# Patient Record
Sex: Male | Born: 1995 | Race: White | Hispanic: No | Marital: Married | Smoking: Never smoker
Health system: Southern US, Community
[De-identification: ages and names within clinical notes are randomized; demographics above are authoritative.]

## PROBLEM LIST (undated history)

## (undated) DIAGNOSIS — G4730 Sleep apnea, unspecified: Secondary | ICD-10-CM

## (undated) DIAGNOSIS — K219 Gastro-esophageal reflux disease without esophagitis: Secondary | ICD-10-CM

## (undated) HISTORY — DX: Sleep apnea, unspecified: G47.30

## (undated) HISTORY — DX: Gastro-esophageal reflux disease without esophagitis: K21.9

---

## 2013-04-10 ENCOUNTER — Encounter (HOSPITAL_COMMUNITY): Payer: Self-pay

## 2013-04-10 ENCOUNTER — Emergency Department (HOSPITAL_COMMUNITY)
Admission: EM | Admit: 2013-04-10 | Discharge: 2013-04-10 | Disposition: A | Discharge status: Home / Self Care | Payer: BC Managed Care – PPO | Attending: Emergency Medicine | Admitting: Emergency Medicine

## 2013-04-10 ENCOUNTER — Emergency Department (HOSPITAL_COMMUNITY): Payer: BC Managed Care – PPO

## 2013-04-10 DIAGNOSIS — Y9389 Activity, other specified: Secondary | ICD-10-CM | POA: Insufficient documentation

## 2013-04-10 DIAGNOSIS — W010XXA Fall on same level from slipping, tripping and stumbling without subsequent striking against object, initial encounter: Secondary | ICD-10-CM | POA: Insufficient documentation

## 2013-04-10 DIAGNOSIS — Y92410 Unspecified street and highway as the place of occurrence of the external cause: Secondary | ICD-10-CM | POA: Insufficient documentation

## 2013-04-10 DIAGNOSIS — S63501A Unspecified sprain of right wrist, initial encounter: Secondary | ICD-10-CM

## 2013-04-10 DIAGNOSIS — S63509A Unspecified sprain of unspecified wrist, initial encounter: Secondary | ICD-10-CM | POA: Insufficient documentation

## 2013-04-10 MED ORDER — ONDANSETRON HCL 4 MG PO TABS
ORAL_TABLET | ORAL | Status: AC
  Administered 2013-04-10: 4 mg
  Filled 2013-04-10: qty 1

## 2013-04-10 MED ORDER — DICLOFENAC SODIUM 75 MG PO TBEC: 75.0000 mg | DELAYED_RELEASE_TABLET | Freq: Two times a day (BID) | ORAL | Status: DC

## 2013-04-10 MED ORDER — KETOROLAC TROMETHAMINE 10 MG PO TABS
ORAL_TABLET | ORAL | Status: AC
  Administered 2013-04-10: 10 mg
  Filled 2013-04-10: qty 1

## 2013-04-10 NOTE — ED Provider Notes (Signed)
History     CSN: [id number]  Arrival date & time 04/10/13  2101   First MD Initiated Contact with Patient 04/10/13 2246      Chief Complaint  Patient presents with  . Wrist Pain  . Fall    (Consider location/radiation/quality/duration/timing/severity/associated sxs/prior treatment) Patient is a 17 y.o. male presenting with wrist pain and fall. The history is provided by the patient.  Wrist Pain This is a new problem. The current episode started yesterday. The problem occurs constantly. The problem has been unchanged. Pertinent negatives include no abdominal pain, arthralgias, chest pain, coughing, neck pain or numbness. Exacerbated by: movement and palpation. He has tried ice and NSAIDs for the symptoms. The treatment provided mild relief.  Fall Pertinent negatives include no abdominal pain, arthralgias, chest pain, coughing, neck pain or numbness.    History reviewed. No pertinent past medical history.  History reviewed. No pertinent past surgical history.  No family history on file.  History  Substance Use Topics  . Smoking status: Never Smoker   . Smokeless tobacco: Not on file  . Alcohol Use: No      Review of Systems  Constitutional: Negative for activity change.       All ROS Neg except as noted in HPI  HENT: Negative for nosebleeds and neck pain.   Eyes: Negative for photophobia and discharge.  Respiratory: Negative for cough, shortness of breath and wheezing.   Cardiovascular: Negative for chest pain and palpitations.  Gastrointestinal: Negative for abdominal pain and blood in stool.  Genitourinary: Negative for dysuria, frequency and hematuria.  Musculoskeletal: Negative for back pain and arthralgias.  Skin: Negative.   Neurological: Negative for dizziness, seizures, speech difficulty and numbness.  Psychiatric/Behavioral: Negative for hallucinations and confusion.    Allergies  Bee venom  Home Medications  No current outpatient prescriptions on  file.  BP 119/73  Pulse 81  Temp(Src) 98.4 F (36.9 C) (Oral)  Resp 20  Ht 6' (1.829 m)  Wt 195 lb (88.451 kg)  BMI 26.44 kg/m2  SpO2 100%  Physical Exam  Nursing note and vitals reviewed. Constitutional: He is oriented to person, place, and time. He appears well-developed and well-nourished.  Non-toxic appearance.  HENT:  Head: Normocephalic.  Right Ear: Tympanic membrane and external ear normal.  Left Ear: Tympanic membrane and external ear normal.  Eyes: EOM and lids are normal. Pupils are equal, round, and reactive to light.  Neck: Normal range of motion. Neck supple. Carotid bruit is not present.  Cardiovascular: Normal rate, regular rhythm, normal heart sounds, intact distal pulses and normal pulses.   Pulmonary/Chest: Breath sounds normal. No respiratory distress.  Abdominal: Soft. Bowel sounds are normal. There is no tenderness. There is no guarding.  Musculoskeletal: Normal range of motion.  There is full range of motion of the right shoulder and elbow. There is soreness with range of motion of the wrist on the right. There is mild-to-moderate swelling of the dorsum of the right hand. Capillary refill is less than 3 seconds. Radial pulses are 2+ and symmetrical.  Lymphadenopathy:       Head (right side): No submandibular adenopathy present.       Head (left side): No submandibular adenopathy present.    He has no cervical adenopathy.  Neurological: He is alert and oriented to person, place, and time. He has normal strength. No cranial nerve deficit or sensory deficit.  Skin: Skin is warm and dry.  Psychiatric: He has a normal mood and affect. His speech  is normal.    ED Course  Procedures (including critical care time)  Labs Reviewed - No data to display Dg Wrist Complete Right  04/10/2013   *RADIOLOGY REPORT*  Clinical Data: Right wrist pain after fall.  RIGHT WRIST - COMPLETE 3+ VIEW  Comparison:  None.  Findings:  There is no evidence of fracture or dislocation.   There is no evidence of arthropathy or other focal bone abnormality. Soft tissues are unremarkable.  IMPRESSION: Negative.   Original Report Authenticated By: Irish Lack, M.D.     No diagnosis found.    MDM  I have reviewed nursing notes, vital signs, and all appropriate lab and imaging results for this patient. Patient fell on the right wrist curled under him on yesterday. (June 9) patient has soreness on last evening, but more pain and swelling today. X-ray of the right wrist is negative for fracture or dislocation.  Patient is fitted with a Velcro wrist splint, to be used for the next 7-10 days. Patient is given an ice pack to use. Prescription for diclofenac 2 times daily with food given to the patient. Patient is to followup with Dr. Romeo Apple if not improving.       Kathie Dike, PA-C 04/10/13 2257

## 2013-04-10 NOTE — ED Notes (Signed)
Fell and injured right wrist per pt.

## 2013-04-10 NOTE — ED Notes (Addendum)
Larey Seat last night -says he tripped in his bedroom, injurying his rt wrist.. Given ice pack, sl swelling present.  Good pulse and distal sensation

## 2013-04-12 NOTE — ED Provider Notes (Signed)
Medical screening examination/treatment/procedure(s) were performed by non-physician practitioner and as supervising physician I was immediately available for consultation/collaboration.  Evangelyn Crouse, MD 04/12/13 0107 

## 2013-10-11 IMAGING — CR DG WRIST COMPLETE 3+V*R*
2 series · 2 of 2 positions shown · non-contrast
Comparison: None.

CLINICAL DATA: Right wrist pain after fall.

RIGHT WRIST - COMPLETE 3+ VIEW

[view not recorded (1 of 2)]
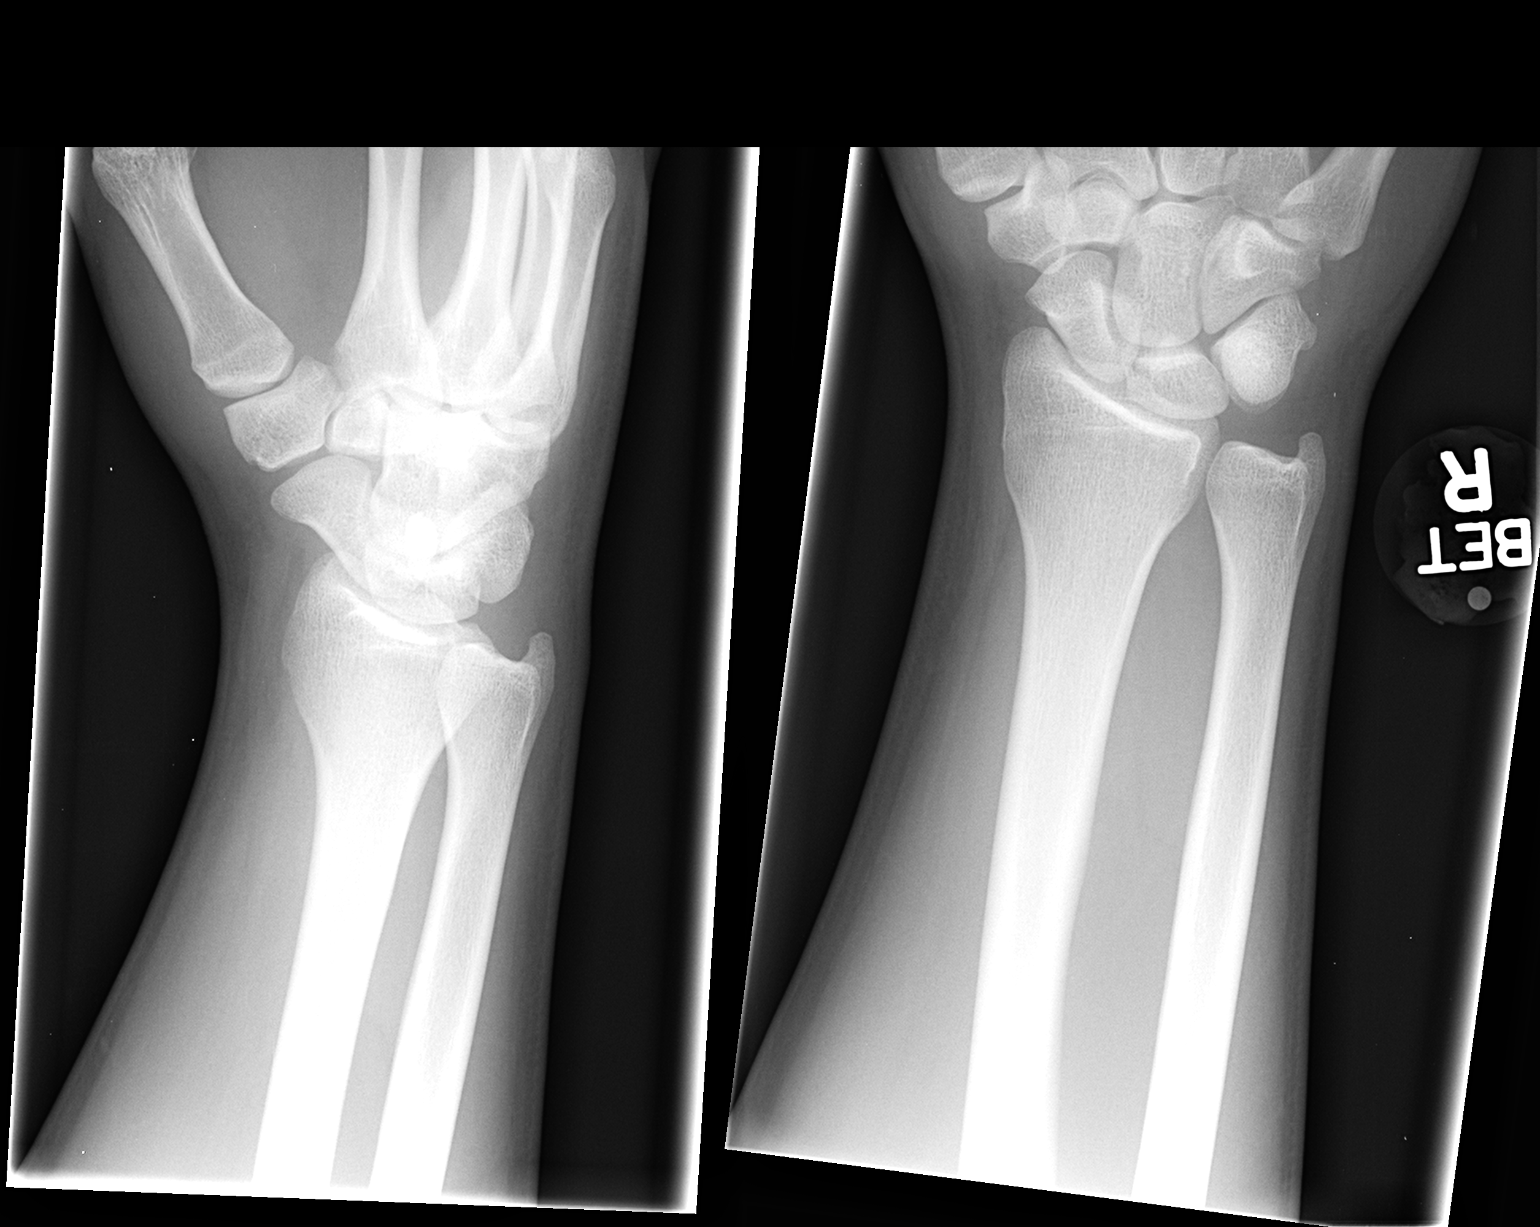

[view not recorded (2 of 2)]
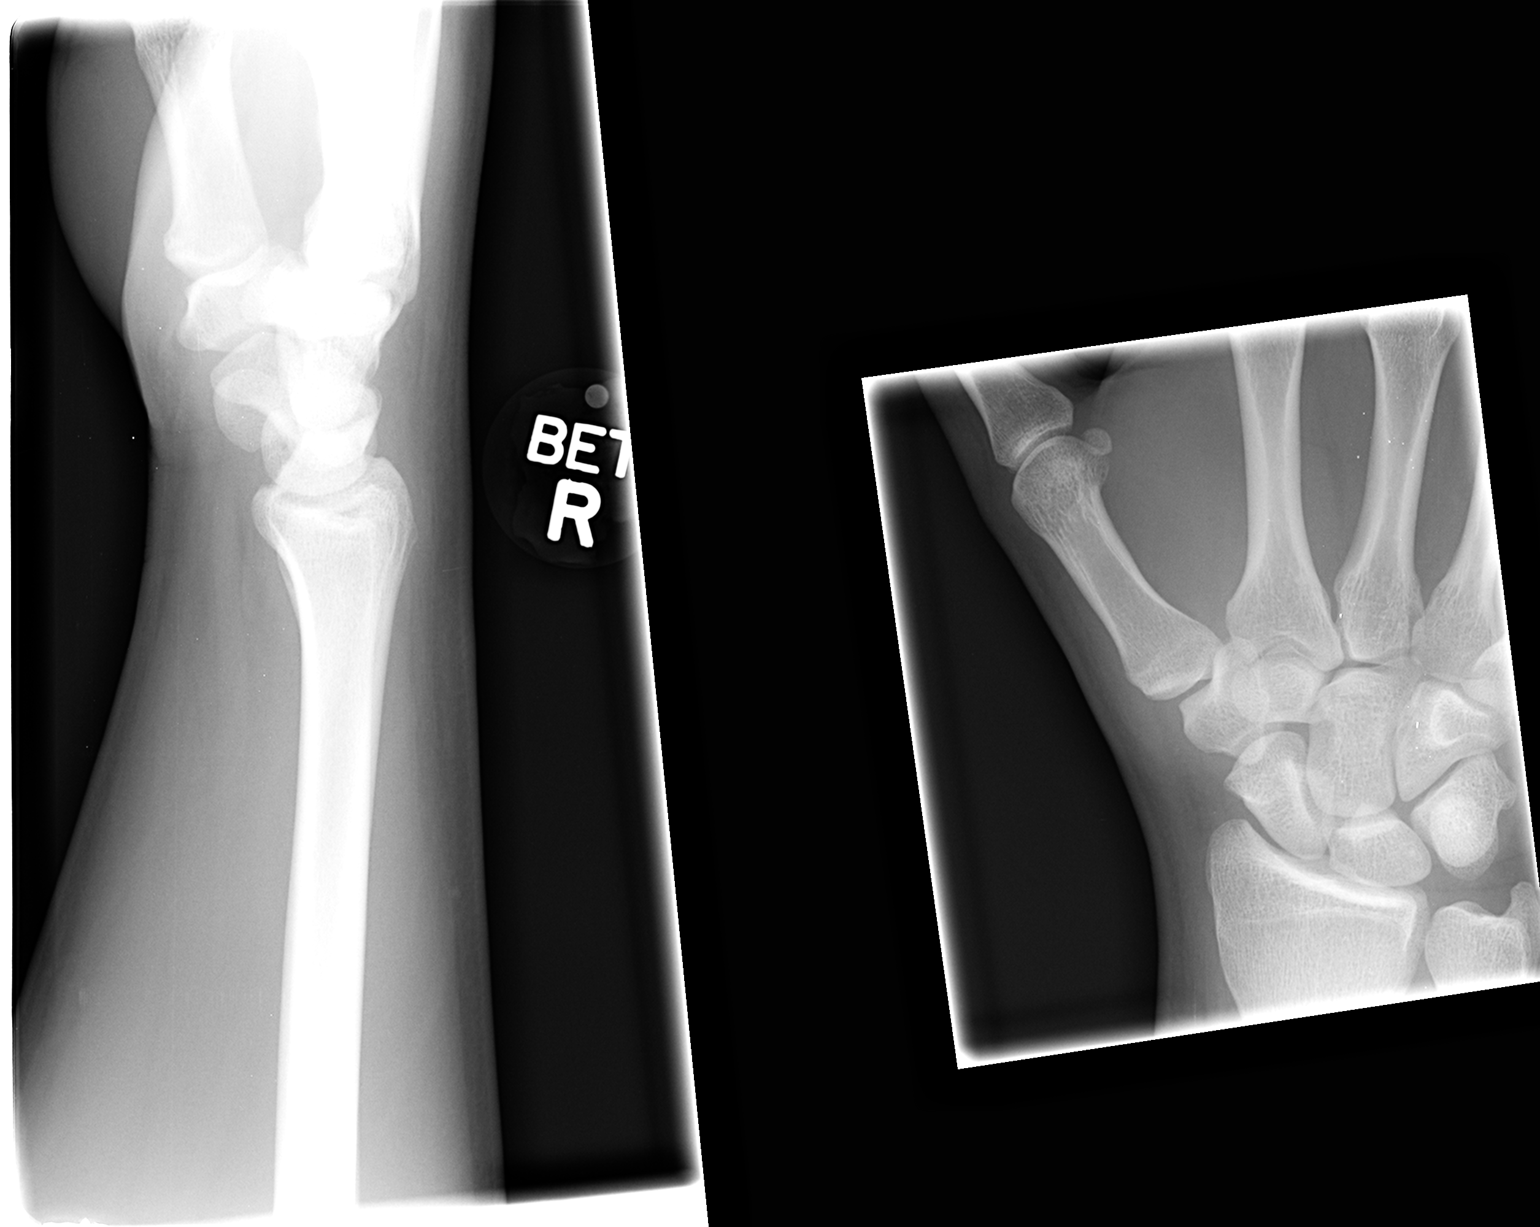

[2 of 2 positions shown; findings below may reference images not displayed]

FINDINGS: There is no evidence of fracture or dislocation.  There
is no evidence of arthropathy or other focal bone abnormality.
Soft tissues are unremarkable.
IMPRESSION: Negative.

## 2019-07-05 ENCOUNTER — Other Ambulatory Visit: Payer: Self-pay

## 2019-07-05 DIAGNOSIS — U071 COVID-19: Secondary | ICD-10-CM

## 2019-07-06 LAB — NOVEL CORONAVIRUS, NAA: SARS-CoV-2, NAA: DETECTED — AB

## 2020-05-09 ENCOUNTER — Ambulatory Visit
Admission: EM | Admit: 2020-05-09 | Discharge: 2020-05-09 | Disposition: A | Discharge status: Home / Self Care | Payer: PRIVATE HEALTH INSURANCE | Attending: Emergency Medicine | Admitting: Emergency Medicine

## 2020-05-09 ENCOUNTER — Encounter: Payer: Self-pay | Admitting: Emergency Medicine

## 2020-05-09 DIAGNOSIS — H66001 Acute suppurative otitis media without spontaneous rupture of ear drum, right ear: Secondary | ICD-10-CM | POA: Diagnosis not present

## 2020-05-09 DIAGNOSIS — J069 Acute upper respiratory infection, unspecified: Secondary | ICD-10-CM

## 2020-05-09 MED ORDER — AMOXICILLIN-POT CLAVULANATE 875-125 MG PO TABS: 1.0000 | ORAL_TABLET | Freq: Two times a day (BID) | ORAL | 0 refills | Status: AC

## 2020-05-09 NOTE — Discharge Instructions (Addendum)
Rest and drink plenty of fluids Prescribed augmentin.  Take as directed.   Take medications as directed and to completion Continue to use OTC ibuprofen and/ or tylenol as needed for pain control Follow up with PCP if symptoms persists Return here or go to the ER if you have any new or worsening symptoms fever, chills, nausea, vomiting, redness, swelling, etc..Marland Kitchen

## 2020-05-09 NOTE — ED Triage Notes (Signed)
Congestion and RT ear pain for past 3 days

## 2020-05-09 NOTE — ED Provider Notes (Signed)
[facility]   [id number] 05/09/20 Arrival Time: 1303   CC: ear pain; cough  SUBJECTIVE: History from: patient.  Mitchell Cochran is a 24 y.o. male who presents with RT ear pain, runny nose, congestion, and productive cough x 5 days.  Denies sick exposure to COVID, flu or strep.  Has tried OTC medications without relief.  Denies aggravating factors.  Reports previous symptoms in the past with ear infection.   Denies fever, chills, SOB, wheezing, chest pain, nausea, changes in bowel or bladder habits.    ROS: As per HPI.  All other pertinent ROS negative.     History reviewed. No pertinent past medical history. History reviewed. No pertinent surgical history. Allergies  Allergen Reactions  . Bee Venom Anaphylaxis   No current facility-administered medications on file prior to encounter.   Current Outpatient Medications on File Prior to Encounter  Medication Sig Dispense Refill  . diclofenac (VOLTAREN) 75 MG EC tablet Take 1 tablet (75 mg total) by mouth 2 (two) times daily. 14 tablet 0   Social History   Socioeconomic History  . Marital status: Single    Spouse name: Not on file  . Number of children: Not on file  . Years of education: Not on file  . Highest education level: Not on file  Occupational History  . Not on file  Tobacco Use  . Smoking status: Never Smoker  . Smokeless tobacco: Never Used  Substance and Sexual Activity  . Alcohol use: No  . Drug use: No  . Sexual activity: Not on file  Other Topics Concern  . Not on file  Social History Narrative  . Not on file   Social Determinants of Health   Financial Resource Strain:   . Difficulty of Paying Living Expenses:   Food Insecurity:   . Worried About Programme researcher, broadcasting/film/video in the Last Year:   . Barista in the Last Year:   Transportation Needs:   . Freight forwarder (Medical):   Marland Kitchen Lack of Transportation (Non-Medical):   Physical Activity:   . Days of Exercise per Week:   . Minutes of  Exercise per Session:   Stress:   . Feeling of Stress :   Social Connections:   . Frequency of Communication with Friends and Family:   . Frequency of Social Gatherings with Friends and Family:   . Attends Religious Services:   . Active Member of Clubs or Organizations:   . Attends Banker Meetings:   Marland Kitchen Marital Status:   Intimate Partner Violence:   . Fear of Current or Ex-Partner:   . Emotionally Abused:   Marland Kitchen Physically Abused:   . Sexually Abused:    No family history on file.  OBJECTIVE:  Vitals:   05/09/20 1330  BP: (!) 147/97  Pulse: (!) 107  Resp: 17  Temp: 98.4 F (36.9 C)  TempSrc: Oral  SpO2: 96%     General appearance: alert; well-appearing, nontoxic; speaking in full sentences and tolerating own secretions HEENT: NCAT; Ears: EACs clear, RT TM erythematous, LT TM pearly gray; Eyes: PERRL.  EOM grossly intact.Nose: nares patent without rhinorrhea, Throat: oropharynx clear, tonsils non erythematous or enlarged, uvula midline  Neck: supple without LAD Lungs: unlabored respirations, symmetrical air entry; cough: mild; no respiratory distress; CTAB Heart: regular rate and rhythm.  Skin: warm and dry Psychological: alert and cooperative; normal mood and affect   ASSESSMENT & PLAN:  1. Non-recurrent acute suppurative otitis media of right  ear without spontaneous rupture of tympanic membrane   2. Viral URI with cough     Meds ordered this encounter  Medications  . amoxicillin-clavulanate (AUGMENTIN) 875-125 MG tablet    Sig: Take 1 tablet by mouth every 12 (twelve) hours for 10 days.    Dispense:  20 tablet    Refill:  0    Order Specific Question:   Supervising Provider    Answer:   Eustace Moore [[id number]]   Rest and drink plenty of fluids Prescribed augmentin.  Take as directed.   Take medications as directed and to completion Continue to use OTC ibuprofen and/ or tylenol as needed for pain control Follow up with PCP if symptoms  persists Return here or go to the ER if you have any new or worsening symptoms fever, chills, nausea, vomiting, redness, swelling, etc...  Reviewed expectations re: course of current medical issues. Questions answered. Outlined signs and symptoms indicating need for more acute intervention. Patient verbalized understanding. After Visit Summary given.         Rennis Harding, PA-C 05/09/20 1356

## 2020-07-02 ENCOUNTER — Other Ambulatory Visit: Payer: Self-pay

## 2020-07-02 ENCOUNTER — Emergency Department (HOSPITAL_COMMUNITY)
Admission: EM | Admit: 2020-07-02 | Discharge: 2020-07-02 | Discharge status: Against Medical Advice | Payer: PRIVATE HEALTH INSURANCE

## 2020-07-02 NOTE — ED Notes (Signed)
Pt called for triage. Pt reports "the pain is better, i'm going to go to my doctor. Assured pt that we would see him if he still wanted to be treated in ED, pt stated "i'm feeling better, i'll just go to my doctor."

## 2020-10-29 ENCOUNTER — Ambulatory Visit: Payer: Self-pay

## 2022-04-06 ENCOUNTER — Encounter: Payer: Self-pay | Admitting: Family Medicine

## 2022-04-06 ENCOUNTER — Ambulatory Visit (INDEPENDENT_AMBULATORY_CARE_PROVIDER_SITE_OTHER): Payer: PRIVATE HEALTH INSURANCE | Admitting: Family Medicine

## 2022-04-06 VITALS — BP 126/86 | HR 95 | Ht 72.0 in | Wt 241.6 lb

## 2022-04-06 DIAGNOSIS — Z1159 Encounter for screening for other viral diseases: Secondary | ICD-10-CM

## 2022-04-06 DIAGNOSIS — Z114 Encounter for screening for human immunodeficiency virus [HIV]: Secondary | ICD-10-CM | POA: Diagnosis not present

## 2022-04-06 DIAGNOSIS — Z0001 Encounter for general adult medical examination with abnormal findings: Secondary | ICD-10-CM

## 2022-04-06 DIAGNOSIS — G4739 Other sleep apnea: Secondary | ICD-10-CM | POA: Diagnosis not present

## 2022-04-06 DIAGNOSIS — E559 Vitamin D deficiency, unspecified: Secondary | ICD-10-CM

## 2022-04-06 DIAGNOSIS — K219 Gastro-esophageal reflux disease without esophagitis: Secondary | ICD-10-CM | POA: Diagnosis not present

## 2022-04-06 DIAGNOSIS — R7301 Impaired fasting glucose: Secondary | ICD-10-CM

## 2022-04-06 DIAGNOSIS — G4730 Sleep apnea, unspecified: Secondary | ICD-10-CM | POA: Insufficient documentation

## 2022-04-06 MED ORDER — OMEPRAZOLE 10 MG PO CPDR: 20.0000 mg | DELAYED_RELEASE_CAPSULE | Freq: Every day | ORAL | 1 refills | Status: DC

## 2022-04-06 NOTE — Assessment & Plan Note (Signed)
-  c/o of heartburn and acid reflux -reports taking OTC Prilosec with minimum relief - will start the patient on omeprazole 20mg  daily -recommended eliminating food that can worsen GERD: peppermint, chocolate, caffeine, alcoholic drinks, tomato sauce, citrus drinks (eg.. orange juice), and fatty foods.

## 2022-04-06 NOTE — Assessment & Plan Note (Signed)
-  report snoring, apneic episodes reported by his wife, and waking up with a really bad morning headache -BMI os 32.77 -he notes feeling tired after a full night's sleep - pt reports recently getting a gym membership to be more physically active  -home sleep study ordered

## 2022-04-06 NOTE — Patient Instructions (Addendum)
I appreciate the opportunity to provide care to you today!    Follow up:  5 months  Labs: please stop by the lab anytime during the week  to get your blood drawn (CBC, CMP, TSH, Lipid profile, HgA1c, Vit D)  Screening: HIV and Hep C  Orders for your home sleep study is placed  Please pick up your medications at the pharmacy   Please continue to a heart-healthy diet and increase your physical activities. Try to exercise for at least three times a week.      It was a pleasure to see you and I look forward to continuing to work together on your health and well-being. Please do not hesitate to call the office if you need care or have questions about your care.   Have a wonderful day and week. With Gratitude, Gilmore Laroche MSN, [name]-BC

## 2022-04-06 NOTE — Progress Notes (Signed)
New Patient Office Visit  Subjective:  Patient ID: Mitchell Cochran, male    DOB: [birth year]  Age: 26 y.o. MRN: [id number]  CC:  Chief Complaint  Patient presents with   New Patient (Initial Visit)    Pt is establishing care, pt complains of acid reflux, previously seen in [place].    HPI Mitchell Cochran is a 26 y.o. male with past medical history of GERD presents for establishing care. GERD: c/o of heartburn and acid reflux. Reports taking OTC Prilosec with minimum relief.  Sleep apnea: onset of symptoms for 1 year. Report snoring, apneic episodes reported by his wife, and waking up with a really bad morning headache. He notes feeling tired after a full night's sleep.   Declines tdap.  History reviewed. No pertinent past medical history.  History reviewed. No pertinent surgical history.  History reviewed. No pertinent family history.  Social History   Socioeconomic History   Marital status: Single    Spouse name: Not on file   Number of children: Not on file   Years of education: Not on file   Highest education level: Not on file  Occupational History   Not on file  Tobacco Use   Smoking status: Never   Smokeless tobacco: Never  Substance and Sexual Activity   Alcohol use: No   Drug use: No   Sexual activity: Not on file  Other Topics Concern   Not on file  Social History Narrative   Not on file   Social Determinants of Health   Financial Resource Strain: Not on file  Food Insecurity: Not on file  Transportation Needs: Not on file  Physical Activity: Not on file  Stress: Not on file  Social Connections: Not on file  Intimate Partner Violence: Not on file    ROS Review of Systems  Constitutional:  Negative for chills, fatigue and fever.  HENT:  Negative for sinus pressure, sinus pain, sneezing and sore throat.   Respiratory:  Negative for chest tightness, shortness of breath and wheezing.   Cardiovascular:  Negative for chest pain and palpitations.   Gastrointestinal:  Negative for nausea and vomiting.       Acid reflux  Endocrine: Negative for polydipsia, polyphagia and polyuria.  Genitourinary:  Negative for frequency, hematuria, penile pain and urgency.  Musculoskeletal:  Negative for back pain, myalgias and neck pain.  Skin:  Negative for rash and wound.  Neurological:  Positive for headaches. Negative for dizziness, tremors, weakness and numbness.  Hematological:  Does not bruise/bleed easily.  Psychiatric/Behavioral:  Positive for sleep disturbance. Negative for confusion, self-injury and suicidal ideas.    Objective:   Today's Vitals: BP 126/86   Pulse 95   Ht 6' (1.829 m)   Wt 241 lb 9.6 oz (109.6 kg)   SpO2 96%   BMI 32.77 kg/m   Physical Exam Constitutional:      Appearance: Normal appearance.  HENT:     Head: Normocephalic.     Right Ear: External ear normal.     Left Ear: External ear normal.     Nose: No congestion or rhinorrhea.  Eyes:     Extraocular Movements: Extraocular movements intact.     Pupils: Pupils are equal, round, and reactive to light.  Cardiovascular:     Rate and Rhythm: Normal rate.     Pulses: Normal pulses.     Heart sounds: Normal heart sounds.  Pulmonary:     Effort: Pulmonary effort is normal.  Breath sounds: Normal breath sounds.  Abdominal:     Palpations: Abdomen is soft.  Musculoskeletal:     Cervical back: No rigidity.     Right lower leg: No edema.     Left lower leg: No edema.  Skin:    General: Skin is warm.  Neurological:     Mental Status: He is oriented to person, place, and time.  Psychiatric:     Comments: Normal affect    Assessment & Plan:   Problem List Items Addressed This Visit       Respiratory   Sleep apnea in adult    -report snoring, apneic episodes reported by his wife, and waking up with a really bad morning headache -BMI os 32.77 -he notes feeling tired after a full night's sleep - pt reports recently getting a gym membership to be  more physically active  -home sleep study ordered        Digestive   GERD (gastroesophageal reflux disease) - Primary    -c/o of heartburn and acid reflux -reports taking OTC Prilosec with minimum relief - will start the patient on omeprazole $RemoveBefor'20mg'[id number]$  daily -recommended eliminating food that can worsen GERD: peppermint, chocolate, caffeine, alcoholic drinks, tomato sauce, citrus drinks (eg.. orange juice), and fatty foods.       Relevant Medications   omeprazole (PRILOSEC) 10 MG capsule   Other Visit Diagnoses     Other sleep apnea       Relevant Orders   Home sleep test   Encounter for screening for HIV       Relevant Orders   HIV antibody (with reflex)   Need for hepatitis C screening test       Relevant Orders   Hepatitis C antibody   Vitamin D deficiency       Relevant Orders   Vitamin D (25 hydroxy)   IFG (impaired fasting glucose)       Relevant Orders   Hemoglobin A1C   Encounter for general adult medical examination with abnormal findings       Relevant Orders   CBC with Differential/Platelet   CMP14+EGFR   TSH + free T4   Lipid panel       Outpatient Encounter Medications as of 04/06/2022  Medication Sig   [DISCONTINUED] omeprazole (PRILOSEC) 10 MG capsule Take 20 mg by mouth daily.   diclofenac (VOLTAREN) 75 MG EC tablet Take 1 tablet (75 mg total) by mouth 2 (two) times daily.   omeprazole (PRILOSEC) 10 MG capsule Take 2 capsules (20 mg total) by mouth daily.   No facility-administered encounter medications on file as of 04/06/2022.    Follow-up: Return in about 5 months (around 09/06/2022).   Alvira Monday, [name]

## 2022-09-06 ENCOUNTER — Ambulatory Visit: Payer: PRIVATE HEALTH INSURANCE | Admitting: Nurse Practitioner

## 2022-09-07 ENCOUNTER — Ambulatory Visit: Payer: PRIVATE HEALTH INSURANCE | Admitting: Family Medicine

## 2022-09-21 ENCOUNTER — Encounter: Payer: Self-pay | Admitting: Family Medicine

## 2022-09-21 ENCOUNTER — Ambulatory Visit: Payer: PRIVATE HEALTH INSURANCE | Admitting: Family Medicine

## 2022-09-21 VITALS — BP 128/83 | HR 97 | Ht 72.0 in | Wt 225.0 lb

## 2022-09-21 DIAGNOSIS — E038 Other specified hypothyroidism: Secondary | ICD-10-CM

## 2022-09-21 DIAGNOSIS — Z114 Encounter for screening for human immunodeficiency virus [HIV]: Secondary | ICD-10-CM

## 2022-09-21 DIAGNOSIS — R7301 Impaired fasting glucose: Secondary | ICD-10-CM

## 2022-09-21 DIAGNOSIS — G4730 Sleep apnea, unspecified: Secondary | ICD-10-CM

## 2022-09-21 DIAGNOSIS — E7849 Other hyperlipidemia: Secondary | ICD-10-CM

## 2022-09-21 DIAGNOSIS — Z1159 Encounter for screening for other viral diseases: Secondary | ICD-10-CM

## 2022-09-21 DIAGNOSIS — E559 Vitamin D deficiency, unspecified: Secondary | ICD-10-CM | POA: Diagnosis not present

## 2022-09-21 DIAGNOSIS — M25551 Pain in right hip: Secondary | ICD-10-CM | POA: Diagnosis not present

## 2022-09-21 NOTE — Patient Instructions (Signed)
I appreciate the opportunity to provide care to you today!    Follow up:  4  months  Labs: please stop by the lab during the week to get your blood drawn (CBC, CMP, TSH, Lipid profile, HgA1c, Vit D)  Screening: HIV and Hep C     Please continue to a heart-healthy diet and increase your physical activities. Try to exercise for at least three times a week.      It was a pleasure to see you and I look forward to continuing to work together on your health and well-being. Please do not hesitate to call the office if you need care or have questions about your care.   Have a wonderful day and week. With Gratitude, Gilmore Laroche MSN, [name]-BC

## 2022-09-21 NOTE — Assessment & Plan Note (Signed)
He rates the pain 6 out of 10 when wearing his duty belt and reports no pain without the duty belt No pain reported today He would like a doctor's note stating that he can have some of the equipment removed from the right hip to the vest to relieve the weight of the extra gear on the right hip Notes provided

## 2022-09-21 NOTE — Progress Notes (Signed)
Established Patient Office Visit  Subjective:  Patient ID: Mitchell Cochran, male    DOB: [birth year]  Age: 27 y.o. MRN: [id number]  CC:  Chief Complaint  Patient presents with   Follow-up    5 month f/u, reports headache when he wakes up, tried to do the sleep apnea test at home was not able to sleep with the machine on.    Hip Pain    Pt reports hip pain would like a work note for work due to extra gear he has to have on while working.     HPI Mitchell Cochran is a 26 y.o. male with past medical history of sleep apnea, and GERD presents for f/u of  chronic medical conditions.    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  He complains of right hip pain, for which he saw a chiropractor in [place], [place].  He rates the pain 6 out of 10 when wearing his duty belt and reports no pain without the duty belt.  He would like a doctor's note stating that he can have some of the equipment removed from the right hip to the vest to relieve the weight of the extra gear on the right hip.  Sleep apnea: He has lost 16 pounds since his last visit on 04/06/2022.  He reports lifestyle changes with a heart-healthy diet and increased physical activities.  He stopped using the CPAP machine, reporting difficulty sleeping with imaging.  He denies morning headaches since losing weight and reports sleeping well at nighttime.  Wt Readings from Last 3 Encounters:  09/21/22 225 lb (102.1 kg)  04/06/22 241 lb 9.6 oz (109.6 kg)  04/10/13 195 lb (88.5 kg) (91 %, Z= 1.34)*   * Growth percentiles are based on CDC (Boys, 2-20 Years) data.     History reviewed. No pertinent past medical history.  History reviewed. No pertinent surgical history.  History reviewed. No pertinent family history.  Social History   Socioeconomic History   Marital status: Married    Spouse name: Not on file   Number of children: Not on file   Years of education:  Not on file   Highest education level: Not on file  Occupational History   Not on file  Tobacco Use   Smoking status: Never   Smokeless tobacco: Never  Substance and Sexual Activity   Alcohol use: No   Drug use: No   Sexual activity: Not on file  Other Topics Concern   Not on file  Social History Narrative   Not on file   Social Determinants of Health   Financial Resource Strain: Not on file  Food Insecurity: Not on file  Transportation Needs: Not on file  Physical Activity: Not on file  Stress: Not on file  Social Connections: Not on file  Intimate Partner Violence: Not on file    Outpatient Medications Prior to Visit  Medication Sig Dispense Refill   omeprazole (PRILOSEC) 10 MG capsule Take 2 capsules (20 mg total) by mouth daily. 60 capsule 1   diclofenac (VOLTAREN) 75 MG EC tablet Take 1 tablet (75 mg total) by mouth 2 (two) times daily. 14 tablet 0   No facility-administered medications prior to visit.    Allergies  Allergen Reactions   Bee Venom Anaphylaxis    ROS Review of Systems  Constitutional:  Negative for fatigue and fever.  Eyes:  Negative for visual  disturbance.  Respiratory:  Negative for chest tightness and shortness of breath.   Cardiovascular:  Negative for chest pain and palpitations.  Neurological:  Negative for dizziness and headaches.      Objective:    Physical Exam HENT:     Head: Normocephalic.     Right Ear: External ear normal.  Cardiovascular:     Rate and Rhythm: Normal rate and regular rhythm.     Pulses: Normal pulses.     Heart sounds: Normal heart sounds.  Pulmonary:     Effort: Pulmonary effort is normal.  Musculoskeletal:     Right hip: No tenderness or bony tenderness. Normal range of motion.     Left hip: No tenderness or bony tenderness. Normal range of motion.  Skin:    Findings: No lesion.  Neurological:     Mental Status: He is alert.     BP 128/83   Pulse 97   Ht 6' (1.829 m)   Wt 225 lb (102.1 kg)    SpO2 97%   BMI 30.52 kg/m  Wt Readings from Last 3 Encounters:  09/21/22 225 lb (102.1 kg)  04/06/22 241 lb 9.6 oz (109.6 kg)  04/10/13 195 lb (88.5 kg) (91 %, Z= 1.34)*   * Growth percentiles are based on CDC (Boys, 2-20 Years) data.    No results found for: "TSH" No results found for: "WBC", "HGB", "HCT", "MCV", "PLT" No results found for: "NA", "K", "CHLORIDE", "CO2", "GLUCOSE", "BUN", "CREATININE", "BILITOT", "ALKPHOS", "AST", "ALT", "PROT", "ALBUMIN", "CALCIUM", "ANIONGAP", "EGFR", "GFR" No results found for: "CHOL" No results found for: "HDL" No results found for: "LDLCALC" No results found for: "TRIG" No results found for: "CHOLHDL" No results found for: "HGBA1C"    Assessment & Plan:  Sleep apnea in adult Assessment & Plan: He has lost 16 pounds since his last visit on 04/06/2022.  He reports lifestyle changes with a heart-healthy diet and increased physical activities He stopped using the CPAP machine, reporting difficulty sleeping with imaging He denies morning headaches since losing weight and reports sleeping well at nighttime Wt Readings from Last 3 Encounters:  09/21/22 225 lb (102.1 kg)  04/06/22 241 lb 9.6 oz (109.6 kg)  04/10/13 195 lb (88.5 kg) (91 %, Z= 1.34)*   * Growth percentiles are based on CDC (Boys, 2-20 Years) data.        Right hip pain Assessment & Plan: He rates the pain 6 out of 10 when wearing his duty belt and reports no pain without the duty belt No pain reported today He would like a doctor's note stating that he can have some of the equipment removed from the right hip to the vest to relieve the weight of the extra gear on the right hip Notes provided   IFG (impaired fasting glucose) -     Hemoglobin A1c  Vitamin D deficiency -     VITAMIN D 25 Hydroxy (Vit-D Deficiency, Fractures)  Need for hepatitis C screening test -     Hepatitis C antibody  Encounter for screening for HIV -     HIV Antibody (routine testing w  rflx)  Other specified hypothyroidism -     TSH + free T4  Other hyperlipidemia -     Lipid panel -     CMP14+EGFR -     CBC with Differential/Platelet    Follow-up: Return in about 4 months (around 01/20/2023).   Alvira Monday, [name]

## 2022-09-21 NOTE — Assessment & Plan Note (Addendum)
He has lost 16 pounds since his last visit on 04/06/2022.  He reports lifestyle changes with a heart-healthy diet and increased physical activities He stopped using the CPAP machine, reporting difficulty sleeping with imaging He denies morning headaches since losing weight and reports sleeping well at nighttime Wt Readings from Last 3 Encounters:  09/21/22 225 lb (102.1 kg)  04/06/22 241 lb 9.6 oz (109.6 kg)  04/10/13 195 lb (88.5 kg) (91 %, Z= 1.34)*   * Growth percentiles are based on CDC (Boys, 2-20 Years) data.

## 2023-01-21 ENCOUNTER — Ambulatory Visit: Payer: PRIVATE HEALTH INSURANCE | Admitting: Family Medicine

## 2023-02-09 ENCOUNTER — Encounter: Payer: Self-pay | Admitting: Family Medicine

## 2023-02-10 ENCOUNTER — Other Ambulatory Visit: Payer: Self-pay

## 2023-02-10 ENCOUNTER — Other Ambulatory Visit: Payer: Self-pay | Admitting: Family Medicine

## 2023-02-10 DIAGNOSIS — K219 Gastro-esophageal reflux disease without esophagitis: Secondary | ICD-10-CM

## 2023-02-10 MED ORDER — OMEPRAZOLE 10 MG PO CPDR: 20.0000 mg | DELAYED_RELEASE_CAPSULE | Freq: Every day | ORAL | 1 refills | Status: DC

## 2023-02-10 NOTE — Telephone Encounter (Signed)
Rx sent 

## 2023-03-16 ENCOUNTER — Ambulatory Visit (INDEPENDENT_AMBULATORY_CARE_PROVIDER_SITE_OTHER): Payer: PRIVATE HEALTH INSURANCE | Admitting: Family Medicine

## 2023-03-16 ENCOUNTER — Encounter: Payer: Self-pay | Admitting: Family Medicine

## 2023-03-16 VITALS — BP 132/86 | HR 103 | Ht 72.0 in | Wt 231.0 lb

## 2023-03-16 DIAGNOSIS — R7301 Impaired fasting glucose: Secondary | ICD-10-CM | POA: Diagnosis not present

## 2023-03-16 DIAGNOSIS — Z0001 Encounter for general adult medical examination with abnormal findings: Secondary | ICD-10-CM | POA: Diagnosis not present

## 2023-03-16 DIAGNOSIS — G4730 Sleep apnea, unspecified: Secondary | ICD-10-CM

## 2023-03-16 DIAGNOSIS — E559 Vitamin D deficiency, unspecified: Secondary | ICD-10-CM

## 2023-03-16 DIAGNOSIS — G479 Sleep disorder, unspecified: Secondary | ICD-10-CM

## 2023-03-16 DIAGNOSIS — E7849 Other hyperlipidemia: Secondary | ICD-10-CM

## 2023-03-16 DIAGNOSIS — E0789 Other specified disorders of thyroid: Secondary | ICD-10-CM

## 2023-03-16 DIAGNOSIS — Z1159 Encounter for screening for other viral diseases: Secondary | ICD-10-CM

## 2023-03-16 DIAGNOSIS — G4739 Other sleep apnea: Secondary | ICD-10-CM

## 2023-03-16 MED ORDER — HYDROXYZINE PAMOATE 25 MG PO CAPS: 25.0000 mg | ORAL_CAPSULE | Freq: Three times a day (TID) | ORAL | 0 refills | Status: DC | PRN

## 2023-03-16 MED ORDER — HYDROXYZINE PAMOATE 25 MG PO CAPS: 25.0000 mg | ORAL_CAPSULE | Freq: Every evening | ORAL | 0 refills | Status: DC | PRN

## 2023-03-16 NOTE — Assessment & Plan Note (Signed)
Referral placed to sleep med solution Reports increased snoring with daytime somnolence

## 2023-03-16 NOTE — Progress Notes (Signed)
Complete physical exam  Patient: Mitchell Cochran   DOB: July 01, 1996   27 y.o. Male  MRN: [id number]  Subjective:    Chief Complaint  Patient presents with   Annual Exam    Cpe today.    Wassim Sholler is a 27 y.o. male who presents today for a complete physical exam. He reports consuming a general diet. Gym/ health club routine includes cardio. He generally feels fairly well. He reports sleeping fairly well. He does not have additional problems to discuss today.    Most recent fall risk assessment:    03/16/2023    3:41 PM  Fall Risk   Falls in the past year? 0  Number falls in past yr: 0  Injury with Fall? 0  Risk for fall due to : No Fall Risks  Follow up Falls evaluation completed     Most recent depression screenings:    03/16/2023    3:42 PM 03/16/2023    3:35 PM  PHQ 2/9 Scores  PHQ - 2 Score 0 0  PHQ- 9 Score 0 0    Dental: No current dental problems and Last dental visit: 6 months ago  Patient Active Problem List   Diagnosis Date Noted   Encounter for general adult medical examination with abnormal findings 03/16/2023   Sleep disturbance 03/16/2023   Right hip pain 09/21/2022   GERD (gastroesophageal reflux disease) 04/06/2022   Sleep apnea in adult 04/06/2022   History reviewed. No pertinent past medical history. History reviewed. No pertinent surgical history. Social History   Tobacco Use   Smoking status: Never   Smokeless tobacco: Never  Substance Use Topics   Alcohol use: No   Drug use: No   Social History   Socioeconomic History   Marital status: Married    Spouse name: Not on file   Number of children: Not on file   Years of education: Not on file   Highest education level: Not on file  Occupational History   Not on file  Tobacco Use   Smoking status: Never   Smokeless tobacco: Never  Substance and Sexual Activity   Alcohol use: No   Drug use: No   Sexual activity: Not on file  Other Topics Concern   Not on file  Social History  Narrative   Not on file   Social Determinants of Health   Financial Resource Strain: Not on file  Food Insecurity: Not on file  Transportation Needs: Not on file  Physical Activity: Not on file  Stress: Not on file  Social Connections: Not on file  Intimate Partner Violence: Not on file   No family status information on file.   History reviewed. No pertinent family history. Allergies  Allergen Reactions   Bee Venom Anaphylaxis      Patient Care Team: Gilmore Laroche, [name] as PCP - General (Family Medicine)   Outpatient Medications Prior to Visit  Medication Sig   omeprazole (PRILOSEC) 10 MG capsule Take 2 capsules (20 mg total) by mouth daily.   diclofenac (VOLTAREN) 75 MG EC tablet Take 1 tablet (75 mg total) by mouth 2 (two) times daily.   No facility-administered medications prior to visit.    Review of Systems  Constitutional:  Negative for chills and fever.  HENT:  Negative for ear discharge, ear pain, sinus pain and sore throat.   Eyes:  Negative for double vision and photophobia.  Respiratory:  Negative for cough.   Cardiovascular:  Negative for chest pain, palpitations and leg  swelling.  Gastrointestinal:  Negative for abdominal pain, constipation, nausea and vomiting.  Genitourinary:  Negative for dysuria, frequency, hematuria and urgency.  Musculoskeletal:  Negative for back pain and joint pain.  Skin:  Negative for itching and rash.  Neurological:  Negative for dizziness and headaches.  Psychiatric/Behavioral:  Negative for memory loss. The patient does not have insomnia.        Objective:    BP 132/86   Pulse (!) 103   Ht 6' (1.829 m)   Wt 231 lb 0.6 oz (104.8 kg)   SpO2 97%   BMI 31.33 kg/m  BP Readings from Last 3 Encounters:  03/16/23 132/86  09/21/22 128/83  04/06/22 126/86   Wt Readings from Last 3 Encounters:  03/16/23 231 lb 0.6 oz (104.8 kg)  09/21/22 225 lb (102.1 kg)  04/06/22 241 lb 9.6 oz (109.6 kg)      Physical Exam HENT:      Head: Normocephalic.     Right Ear: External ear normal.     Left Ear: External ear normal.     Nose: No congestion.     Mouth/Throat:     Mouth: Mucous membranes are moist.  Eyes:     Extraocular Movements: Extraocular movements intact.     Pupils: Pupils are equal, round, and reactive to light.  Cardiovascular:     Rate and Rhythm: Regular rhythm.     Heart sounds: No murmur heard. Pulmonary:     Effort: No respiratory distress.     Breath sounds: Normal breath sounds.  Abdominal:     Tenderness: There is no right CVA tenderness or left CVA tenderness.  Musculoskeletal:     Right lower leg: No edema.     Left lower leg: No edema.  Neurological:     Mental Status: He is alert and oriented to person, place, and time.     GCS: GCS eye subscore is 4. GCS verbal subscore is 5. GCS motor subscore is 6.     Cranial Nerves: No facial asymmetry.     Motor: No atrophy.     Coordination: Coordination normal. Finger-Nose-Finger Test normal.     Gait: Gait normal.  Psychiatric:        Judgment: Judgment normal.     No results found for any visits on 03/16/23. Last CBC No results found for: "WBC", "HGB", "HCT", "MCV", "MCH", "RDW", "PLT" Last metabolic panel No results found for: "GLUCOSE", "NA", "K", "CL", "CO2", "BUN", "CREATININE", "EGFR", "CALCIUM", "PHOS", "PROT", "ALBUMIN", "LABGLOB", "AGRATIO", "BILITOT", "ALKPHOS", "AST", "ALT", "ANIONGAP" Last lipids No results found for: "CHOL", "HDL", "LDLCALC", "LDLDIRECT", "TRIG", "CHOLHDL" Last hemoglobin A1c No results found for: "HGBA1C" Last thyroid functions No results found for: "TSH", "T3TOTAL", "T4TOTAL", "THYROIDAB" Last vitamin D No results found for: "25OHVITD2", "25OHVITD3", "VD25OH" Last vitamin B12 and Folate No results found for: "VITAMINB12", "FOLATE"      Assessment & Plan:    Routine Health Maintenance and Physical Exam   There is no immunization history on file for this patient.  Health Maintenance   Topic Date Due   COVID-19 Vaccine (1) Never done   HIV Screening  Never done   Hepatitis C Screening  Never done   DTaP/Tdap/Td (1 - Tdap) Never done   INFLUENZA VACCINE  06/02/2023   HPV VACCINES  Aged Out    Discussed health benefits of physical activity, and encouraged him to engage in regular exercise appropriate for his age and condition.  Encounter for general adult medical examination with abnormal  findings Assessment & Plan: Physical exam as documented Discussed heart-healthy diet  Encouraged to Exercise: If you are able: 30 -60 minutes a day ,4 days a week, or 150 minutes a week. The longer the better. Combine stretch, strength, and aerobic activities Encourage to eat whole Food, Plant Predominant Nutrition is highly recommended: Eat Plenty of vegetables, Mushrooms, fruits, Legumes, Whole Grains, Nuts, seeds in lieu of processed meats, processed snacks/pastries red meat, poultry, eggs.  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  Dispense: 30 capsule; Refill: 0  Sleep apnea in adult Assessment & Plan: Referral placed to sleep med solution Reports increased snoring with daytime somnolence    IFG (impaired fasting glucose) -     Hemoglobin A1c  Vitamin D deficiency -     VITAMIN D 25 Hydroxy (Vit-D Deficiency, Fractures)  Other hyperlipidemia -     CBC with Differential/Platelet -     CMP14+EGFR -     Lipid panel  Other specified disorders of thyroid -     TSH + free T4  Encounter for hepatitis C screening test for low risk patient -     Hepatitis C antibody  Other sleep apnea -     Ambulatory referral to Sleep Studies    Return in about 3 months (around 06/16/2023).     Gilmore Laroche, [name]

## 2023-03-16 NOTE — Patient Instructions (Addendum)
I appreciate the opportunity to provide care to you today!    Follow up:  3 months  Labs: please stop by the lab during the week to get your blood drawn (CBC, CMP, TSH, Lipid profile, HgA1c, Vit D)  Sleep disturbance Please take hydroxyzine 25 PRN at bedtime for sleep    Please continue to a heart-healthy diet and increase your physical activities. Try to exercise for at least five days a week.      It was a pleasure to see you and I look forward to continuing to work together on your health and well-being. Please do not hesitate to call the office if you need care or have questions about your care.   Have a wonderful day and week. With Gratitude, Gilmore Laroche MSN, [name]-BC

## 2023-03-16 NOTE — Assessment & Plan Note (Signed)
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

## 2023-03-16 NOTE — Assessment & Plan Note (Signed)
Physical exam as documented Discussed heart-healthy diet  Encouraged to Exercise: If you are able: 30 -60 minutes a day ,4 days a week, or 150 minutes a week. The longer the better. Combine stretch, strength, and aerobic activities Encourage to eat whole Food, Plant Predominant Nutrition is highly recommended: Eat Plenty of vegetables, Mushrooms, fruits, Legumes, Whole Grains, Nuts, seeds in lieu of processed meats, processed snacks/pastries red meat, poultry, eggs.  Will f/u in 1 year for CPE    

## 2023-03-17 ENCOUNTER — Telehealth: Payer: Self-pay | Admitting: Family Medicine

## 2023-03-17 NOTE — Telephone Encounter (Signed)
Tried calling pharmacy back, unable to get through.

## 2023-03-17 NOTE — Telephone Encounter (Signed)
Zella Ball called from [place] [phone] needs clarification on prescription received  on Hydroxyzine, they received 2 different prescriptions on Hydroxyzine one says take tid and the other says one at bed time only.

## 2023-03-17 NOTE — Telephone Encounter (Signed)
As needed for sleep

## 2023-03-18 NOTE — Telephone Encounter (Signed)
Spoke to pharmacist Zella Ball, per Malachi Bonds medication is as needed at bedtime.

## 2023-06-08 ENCOUNTER — Ambulatory Visit: Payer: PRIVATE HEALTH INSURANCE | Admitting: Family Medicine

## 2023-11-03 ENCOUNTER — Other Ambulatory Visit: Payer: Self-pay | Admitting: Family Medicine

## 2023-12-29 ENCOUNTER — Telehealth: Payer: Self-pay | Admitting: Family Medicine

## 2023-12-29 ENCOUNTER — Other Ambulatory Visit: Payer: Self-pay | Admitting: Family Medicine

## 2023-12-29 DIAGNOSIS — E038 Other specified hypothyroidism: Secondary | ICD-10-CM

## 2023-12-29 DIAGNOSIS — R7301 Impaired fasting glucose: Secondary | ICD-10-CM

## 2023-12-29 DIAGNOSIS — Z114 Encounter for screening for human immunodeficiency virus [HIV]: Secondary | ICD-10-CM

## 2023-12-29 DIAGNOSIS — E559 Vitamin D deficiency, unspecified: Secondary | ICD-10-CM

## 2023-12-29 DIAGNOSIS — E7849 Other hyperlipidemia: Secondary | ICD-10-CM

## 2023-12-29 DIAGNOSIS — Z1159 Encounter for screening for other viral diseases: Secondary | ICD-10-CM

## 2023-12-29 NOTE — Telephone Encounter (Signed)
 Patient called asked if his provider can order blood work so he can come in the week before his appointment 05.19 to have blood work done.

## 2023-12-29 NOTE — Telephone Encounter (Signed)
 Orders placed.

## 2024-01-10 ENCOUNTER — Encounter: Payer: Self-pay | Admitting: Family Medicine

## 2024-01-12 ENCOUNTER — Other Ambulatory Visit: Payer: Self-pay | Admitting: Family Medicine

## 2024-01-12 DIAGNOSIS — K219 Gastro-esophageal reflux disease without esophagitis: Secondary | ICD-10-CM

## 2024-01-22 ENCOUNTER — Other Ambulatory Visit: Payer: Self-pay | Admitting: Family Medicine

## 2024-01-26 ENCOUNTER — Encounter: Payer: Self-pay | Admitting: *Deleted

## 2024-02-02 ENCOUNTER — Telehealth: Payer: Self-pay

## 2024-02-02 NOTE — Telephone Encounter (Signed)
 Copied from CRM [id number]. Topic: Referral - Question >> Feb 01, 2024  2:11 PM Fredrich Romans wrote: Reason for CRM: Patient would like to have  Referral  that was sent to [facility] , sent to [facility]

## 2024-02-03 NOTE — Telephone Encounter (Signed)
 Tried to call, wouldn't go through

## 2024-02-29 ENCOUNTER — Ambulatory Visit (INDEPENDENT_AMBULATORY_CARE_PROVIDER_SITE_OTHER): Payer: PRIVATE HEALTH INSURANCE | Admitting: Gastroenterology

## 2024-02-29 ENCOUNTER — Encounter (INDEPENDENT_AMBULATORY_CARE_PROVIDER_SITE_OTHER): Payer: Self-pay | Admitting: Gastroenterology

## 2024-02-29 ENCOUNTER — Telehealth (INDEPENDENT_AMBULATORY_CARE_PROVIDER_SITE_OTHER): Payer: Self-pay | Admitting: Gastroenterology

## 2024-02-29 VITALS — BP 118/83 | HR 96 | Temp 97.8°F | Ht 72.0 in | Wt 230.0 lb

## 2024-02-29 DIAGNOSIS — K219 Gastro-esophageal reflux disease without esophagitis: Secondary | ICD-10-CM | POA: Diagnosis not present

## 2024-02-29 DIAGNOSIS — Z6831 Body mass index (BMI) 31.0-31.9, adult: Secondary | ICD-10-CM | POA: Insufficient documentation

## 2024-02-29 MED ORDER — OMEPRAZOLE 40 MG PO CPDR: 40.0000 mg | DELAYED_RELEASE_CAPSULE | Freq: Every day | ORAL | 0 refills | Status: DC

## 2024-02-29 NOTE — Telephone Encounter (Signed)
 Pt needing EGD/Bravo off PPI with Dr.Ahmed. Days that were available pt was unavailable. (We can only do 2 bravos per week/2 per day at this time and we have multiple bravo booked). Will call with June schedule.

## 2024-02-29 NOTE — Patient Instructions (Signed)
 It was very nice to meet you today, as dicussed with will plan for the following :  A)  omeprazole  40mg  daily - advised to take 30 min before breakfast GERD recommendations:  1) Avoid coffee, tea, cola beverages, carbonated beverages, spicy foods, greasy foods, foods high in acid content (e.g. tomatoes and citrus fruits), chocolate, and peppermint 2) Eat small meals and keep weight within normal range 3) Avoid recumbent posture for 3 hours post-prandially 4) Elevate head of bed  B)Upper endoscopy with BRAVO ( hold omeprazole  for 10 days before the procedure

## 2024-02-29 NOTE — H&P (View-Only) (Signed)
 Mitchell Cochran Mitchell Cochran , M.D. Gastroenterology & Hepatology [facility] Gastroenterology [address] [place], [place] [address] Primary Care Physician: Zarwolo, Gloria, [name] [address] [place] [address]  Chief Complaint:  refractory GERD  History of Present Illness: Mitchell Cochran is a 28 y.o. male with sleep apnea who presents for evaluation of refractory GERD.  Patient reports that for many years he would have burning sensation in his throat and would wake up in the middle of the night choking.  Patient has been taking PPI for many years without much relief.  Denies dysphagia.The patient denies having any nausea, vomiting, fever, chills, hematochezia, melena, hematemesis, abdominal distention, abdominal pain, diarrhea, jaundice, pruritus or weight loss.  Last UJW:JXBJ Last Colonoscopy:none  FHx: neg for any gastrointestinal/liver disease, no malignancies Social: neg smoking, alcohol or illicit drug use Surgical: no abdominal surgeries  Past Medical History: Past Medical History:  Diagnosis Date   GERD (gastroesophageal reflux disease)    Sleep apnea     Past Surgical History:No past surgical history on file.  Family History: Family History  Problem Relation Age of Onset   Diabetes Mother    Diabetes Father     Social History: Social History   Tobacco Use  Smoking Status Never   Passive exposure: Never  Smokeless Tobacco Current   Types: Snuff   Social History   Substance and Sexual Activity  Alcohol Use No   Social History   Substance and Sexual Activity  Drug Use No    Allergies: Allergies  Allergen Reactions   Bee Venom Anaphylaxis    Medications: Current Outpatient Medications  Medication Sig Dispense Refill   hydrOXYzine  (VISTARIL ) 25 MG capsule Take 1 capsule (25 mg total) by mouth at bedtime as needed. 30 capsule 0   omeprazole  (PRILOSEC) 40 MG capsule Take 1 capsule (40 mg total) by mouth daily. 30 capsule  0   No current facility-administered medications for this visit.    Review of Systems: GENERAL: negative for malaise, night sweats HEENT: No changes in hearing or vision, no nose bleeds or other nasal problems. NECK: Negative for lumps, goiter, pain and significant neck swelling RESPIRATORY: Negative for cough, wheezing CARDIOVASCULAR: Negative for chest pain, leg swelling, palpitations, orthopnea GI: SEE HPI MUSCULOSKELETAL: Negative for joint pain or swelling, back pain, and muscle pain. SKIN: Negative for lesions, rash HEMATOLOGY Negative for prolonged bleeding, bruising easily, and swollen nodes. ENDOCRINE: Negative for cold or heat intolerance, polyuria, polydipsia and goiter. NEURO: negative for tremor, gait imbalance, syncope and seizures. The remainder of the review of systems is noncontributory.   Physical Exam: BP 118/83   Pulse 96   Temp 97.8 F (36.6 C)   Ht 6' (1.829 m)   Wt 230 lb (104.3 kg)   BMI 31.19 kg/m  GENERAL: The patient is AO x3, in no acute distress. HEENT: Head is normocephalic and atraumatic. EOMI are intact. Mouth is well hydrated and without lesions. NECK: Supple. No masses LUNGS: Clear to auscultation. No presence of rhonchi/wheezing/rales. Adequate chest expansion HEART: RRR, normal s1 and s2. ABDOMEN: Soft, nontender, no guarding, no peritoneal signs, and nondistended. BS +. No masses.   Imaging/Labs: as above      No data to display         No results found for: "IRON", "TIBC", "FERRITIN"  I personally reviewed and interpreted the available labs, imaging and endoscopic files.  Impression and Plan:  Mitchell Cochran is a 28 y.o. male with sleep apnea  who presents for evaluation of refractory GERD.  #Refractory GERD  Patient has typical symptoms of GERD but without much relief with PPI, has been taking it for years  This could be esophageal hypersensitivity, achalasia and need to establish objective data for further evaluation of  GERD  Patient not responding to PPI is a clinical indication for wireless pH monitoring (Bravo testing).  Also with upper endoscopy to evaluate for chronic sequela of GERD (esophagitis, stricture, Barrett's ) is indicated  Proceed with upper endoscopy with esophageal biopsies +/- BRAVO (OFF PPI)   I thoroughly discussed with the patient the procedure, including the risks involved. Patient understands what the procedure involves including the benefits and any risks. Patient understands alternatives to the proposed procedure. Risks including (but not limited to) bleeding, tearing of the lining (perforation), rupture of adjacent organs, problems with heart and lung function, infection, and medication reactions. A small percentage of complications may require surgery, hospitalization, repeat endoscopic procedure, and/or transfusion.  Patient understood and agreed.   1) Avoid coffee, tea, cola beverages, carbonated beverages, spicy foods, greasy foods, foods high in acid content (e.g. tomatoes and citrus fruits), chocolate, and peppermint 2) Eat small meals and keep weight within normal range 3) Avoid recumbent posture for 3 hours post-prandially 4) Elevate head of bed  All questions were answered.      Mitchell Cochran Mitchell Halsey Persaud, MD Gastroenterology and Hepatology [facility] Gastroenterology   This chart has been completed using [facility] Dictation software, and while attempts have been made to ensure accuracy , certain words and phrases may not be transcribed as intended

## 2024-02-29 NOTE — Progress Notes (Signed)
 Mitchell Cochran , M.D. Gastroenterology & Hepatology [facility] Gastroenterology [address] [place], [place] [address] Primary Care Physician: Zarwolo, Gloria, [name] [address] [place] [address]  Chief Complaint:  refractory GERD  History of Present Illness: Mitchell Cochran is a 28 y.o. male with sleep apnea who presents for evaluation of refractory GERD.  Patient reports that for many years he would have burning sensation in his throat and would wake up in the middle of the night choking.  Patient has been taking PPI for many years without much relief.  Denies dysphagia.The patient denies having any nausea, vomiting, fever, chills, hematochezia, melena, hematemesis, abdominal distention, abdominal pain, diarrhea, jaundice, pruritus or weight loss.  Last UJW:JXBJ Last Colonoscopy:none  FHx: neg for any gastrointestinal/liver disease, no malignancies Social: neg smoking, alcohol or illicit drug use Surgical: no abdominal surgeries  Past Medical History: Past Medical History:  Diagnosis Date   GERD (gastroesophageal reflux disease)    Sleep apnea     Past Surgical History:No past surgical history on file.  Family History: Family History  Problem Relation Age of Onset   Diabetes Mother    Diabetes Father     Social History: Social History   Tobacco Use  Smoking Status Never   Passive exposure: Never  Smokeless Tobacco Current   Types: Snuff   Social History   Substance and Sexual Activity  Alcohol Use No   Social History   Substance and Sexual Activity  Drug Use No    Allergies: Allergies  Allergen Reactions   Bee Venom Anaphylaxis    Medications: Current Outpatient Medications  Medication Sig Dispense Refill   hydrOXYzine  (VISTARIL ) 25 MG capsule Take 1 capsule (25 mg total) by mouth at bedtime as needed. 30 capsule 0   omeprazole  (PRILOSEC) 40 MG capsule Take 1 capsule (40 mg total) by mouth daily. 30 capsule  0   No current facility-administered medications for this visit.    Review of Systems: GENERAL: negative for malaise, night sweats HEENT: No changes in hearing or vision, no nose bleeds or other nasal problems. NECK: Negative for lumps, goiter, pain and significant neck swelling RESPIRATORY: Negative for cough, wheezing CARDIOVASCULAR: Negative for chest pain, leg swelling, palpitations, orthopnea GI: SEE HPI MUSCULOSKELETAL: Negative for joint pain or swelling, back pain, and muscle pain. SKIN: Negative for lesions, rash HEMATOLOGY Negative for prolonged bleeding, bruising easily, and swollen nodes. ENDOCRINE: Negative for cold or heat intolerance, polyuria, polydipsia and goiter. NEURO: negative for tremor, gait imbalance, syncope and seizures. The remainder of the review of systems is noncontributory.   Physical Exam: BP 118/83   Pulse 96   Temp 97.8 F (36.6 C)   Ht 6' (1.829 m)   Wt 230 lb (104.3 kg)   BMI 31.19 kg/m  GENERAL: The patient is AO x3, in no acute distress. HEENT: Head is normocephalic and atraumatic. EOMI are intact. Mouth is well hydrated and without lesions. NECK: Supple. No masses LUNGS: Clear to auscultation. No presence of rhonchi/wheezing/rales. Adequate chest expansion HEART: RRR, normal s1 and s2. ABDOMEN: Soft, nontender, no guarding, no peritoneal signs, and nondistended. BS +. No masses.   Imaging/Labs: as above      No data to display         No results found for: "IRON", "TIBC", "FERRITIN"  I personally reviewed and interpreted the available labs, imaging and endoscopic files.  Impression and Plan:  Mitchell Cochran is a 28 y.o. male with sleep apnea  who presents for evaluation of refractory GERD.  #Refractory GERD  Patient has typical symptoms of GERD but without much relief with PPI, has been taking it for years  This could be esophageal hypersensitivity, achalasia and need to establish objective data for further evaluation of  GERD  Patient not responding to PPI is a clinical indication for wireless pH monitoring (Bravo testing).  Also with upper endoscopy to evaluate for chronic sequela of GERD (esophagitis, stricture, Barrett's ) is indicated  Proceed with upper endoscopy with esophageal biopsies +/- BRAVO (OFF PPI)   I thoroughly discussed with the patient the procedure, including the risks involved. Patient understands what the procedure involves including the benefits and any risks. Patient understands alternatives to the proposed procedure. Risks including (but not limited to) bleeding, tearing of the lining (perforation), rupture of adjacent organs, problems with heart and lung function, infection, and medication reactions. A small percentage of complications may require surgery, hospitalization, repeat endoscopic procedure, and/or transfusion.  Patient understood and agreed.   1) Avoid coffee, tea, cola beverages, carbonated beverages, spicy foods, greasy foods, foods high in acid content (e.g. tomatoes and citrus fruits), chocolate, and peppermint 2) Eat small meals and keep weight within normal range 3) Avoid recumbent posture for 3 hours post-prandially 4) Elevate head of bed  All questions were answered.      Mitchell Cochran Mitchell Mitchell Persaud, MD Gastroenterology and Hepatology [facility] Gastroenterology   This chart has been completed using [facility] Dictation software, and while attempts have been made to ensure accuracy , certain words and phrases may not be transcribed as intended

## 2024-03-07 NOTE — Telephone Encounter (Signed)
 Per [employer] for [employer]; no PA needed for EGD/Bravo

## 2024-03-07 NOTE — Telephone Encounter (Signed)
 Contacted pt to see if he was available on 5/29; pt has dentist appt that day but asked was 5/27 available. Pt scheduled for 5/27 at 10:45am. Instructions will be mailed to pt.

## 2024-03-16 ENCOUNTER — Encounter: Payer: PRIVATE HEALTH INSURANCE | Admitting: Family Medicine

## 2024-03-19 ENCOUNTER — Encounter: Payer: Self-pay | Admitting: Family Medicine

## 2024-03-19 ENCOUNTER — Ambulatory Visit (INDEPENDENT_AMBULATORY_CARE_PROVIDER_SITE_OTHER): Payer: PRIVATE HEALTH INSURANCE | Admitting: Family Medicine

## 2024-03-19 VITALS — BP 129/85 | HR 79 | Resp 16 | Ht 72.0 in | Wt 240.8 lb

## 2024-03-19 DIAGNOSIS — Z0001 Encounter for general adult medical examination with abnormal findings: Secondary | ICD-10-CM

## 2024-03-19 NOTE — Assessment & Plan Note (Signed)

## 2024-03-19 NOTE — Patient Instructions (Addendum)
 I appreciate the opportunity to provide care to you today!    Follow up:  1 year for CPE  Labs: please stop by the lab today to get your blood drawn (CBC, CMP, TSH, Lipid profile, HgA1c, Vit D)  For a Healthier YOU, I Recommend: Reducing your intake of sugar, sodium, carbohydrates, and saturated fats. Increasing your fiber intake by incorporating more whole grains, fruits, and vegetables into your meals. Setting healthy goals with a focus on lowering your consumption of carbs, sugar, and unhealthy fats. Adding variety to your diet by including a wide range of fruits and vegetables. Cutting back on soda and limiting processed foods as much as possible. Staying active: In addition to taking your weight loss medication, aim for at least 150 minutes of moderate-intensity physical activity each week for optimal results.   It was a pleasure to see you and I look forward to continuing to work together on your health and well-being. Please do not hesitate to call the office if you need care or have questions about your care.  In case of emergency, please visit the Emergency Department for urgent care, or contact our clinic at [phone] to schedule an appointment. We're here to help you!   Have a wonderful day and week. With Gratitude, Carolan Avedisian MSN, [name]-BC

## 2024-03-19 NOTE — Progress Notes (Signed)
 Complete physical exam  Patient: Mitchell Cochran   DOB: 03-29-1996   28 y.o. Male  MRN: [id number]  Subjective:     Chief Complaint  Patient presents with   Annual Exam    Mitchell Cochran is a 28 y.o. male who presents today for a complete physical exam. He reports consuming a general diet. The patient does not participate in regular exercise at present. He generally feels well. He reports sleeping well. He does not have additional problems to discuss today.    Most recent fall risk assessment:    03/19/2024    8:07 AM  Fall Risk   Falls in the past year? 0  Number falls in past yr: 0  Injury with Fall? 0  Follow up Falls evaluation completed     Most recent depression screenings:    03/19/2024    8:07 AM 03/16/2023    3:42 PM  PHQ 2/9 Scores  PHQ - 2 Score 0 0  PHQ- 9 Score  0    Dental: No current dental problems and Last dental visit: have an  appt 03/28/2024   Patient Active Problem List   Diagnosis Date Noted   BMI 31.0-31.9,adult 02/29/2024   Encounter for general adult medical examination with abnormal findings 03/16/2023   Sleep disturbance 03/16/2023   Right hip pain 09/21/2022   GERD (gastroesophageal reflux disease) 04/06/2022   Sleep apnea in adult 04/06/2022   Past Medical History:  Diagnosis Date   GERD (gastroesophageal reflux disease)    Sleep apnea    No past surgical history on file. Social History   Tobacco Use   Smoking status: Never    Passive exposure: Never   Smokeless tobacco: Current    Types: Snuff  Substance Use Topics   Alcohol use: No   Drug use: No   Social History   Socioeconomic History   Marital status: Married    Spouse name: Not on file   Number of children: Not on file   Years of education: Not on file   Highest education level: Not on file  Occupational History   Not on file  Tobacco Use   Smoking status: Never    Passive exposure: Never   Smokeless tobacco: Current    Types: Snuff  Substance and Sexual  Activity   Alcohol use: No   Drug use: No   Sexual activity: Not on file  Other Topics Concern   Not on file  Social History Narrative   Not on file   Social Drivers of Health   Financial Resource Strain: Not on file  Food Insecurity: Not on file  Transportation Needs: Not on file  Physical Activity: Not on file  Stress: Not on file  Social Connections: Not on file  Intimate Partner Violence: Not on file   Family Status  Relation Name Status   Mother  (Not Specified)   Father  (Not Specified)  No partnership data on file   Family History  Problem Relation Age of Onset   Diabetes Mother    Diabetes Father    Allergies  Allergen Reactions   Bee Venom Anaphylaxis      Patient Care Team: Blakely Maranan, [name] as PCP - General (Family Medicine)   Outpatient Medications Prior to Visit  Medication Sig   omeprazole  (PRILOSEC) 40 MG capsule Take 1 capsule (40 mg total) by mouth daily.   hydrOXYzine  (VISTARIL ) 25 MG capsule Take 1 capsule (25 mg total) by mouth at bedtime as  needed. (Patient not taking: Reported on 03/19/2024)   No facility-administered medications prior to visit.    Review of Systems  Constitutional:  Negative for chills, fever and malaise/fatigue.  HENT:  Negative for congestion and sinus pain.   Eyes:  Negative for pain, discharge and redness.  Respiratory:  Negative for cough, sputum production and shortness of breath.   Cardiovascular:  Negative for chest pain, palpitations, claudication and leg swelling.  Gastrointestinal:  Negative for diarrhea, heartburn and nausea.  Genitourinary:  Negative for flank pain and frequency.  Musculoskeletal:  Negative for back pain and joint pain.  Skin:  Negative for itching.  Neurological:  Negative for dizziness, seizures and headaches.  Endo/Heme/Allergies:  Negative for environmental allergies.  Psychiatric/Behavioral:  Negative for memory loss. The patient does not have insomnia.        Objective:    BP  129/85   Pulse 79   Resp 16   Ht 6' (1.829 m)   Wt 240 lb 12.8 oz (109.2 kg)   SpO2 95%   BMI 32.66 kg/m  BP Readings from Last 3 Encounters:  03/19/24 129/85  02/29/24 118/83  03/16/23 132/86   Wt Readings from Last 3 Encounters:  03/19/24 240 lb 12.8 oz (109.2 kg)  02/29/24 230 lb (104.3 kg)  03/16/23 231 lb 0.6 oz (104.8 kg)      Physical Exam HENT:     Head: Normocephalic.     Right Ear: External ear normal.     Left Ear: External ear normal.     Nose: No congestion.     Mouth/Throat:     Mouth: Mucous membranes are moist.  Eyes:     Extraocular Movements: Extraocular movements intact.     Pupils: Pupils are equal, round, and reactive to light.  Cardiovascular:     Rate and Rhythm: Regular rhythm.     Heart sounds: No murmur heard. Pulmonary:     Effort: No respiratory distress.     Breath sounds: Normal breath sounds.  Abdominal:     Tenderness: There is no right CVA tenderness or left CVA tenderness.  Musculoskeletal:     Right lower leg: No edema.     Left lower leg: No edema.  Neurological:     Mental Status: He is alert and oriented to person, place, and time.     GCS: GCS eye subscore is 4. GCS verbal subscore is 5. GCS motor subscore is 6.     Cranial Nerves: No facial asymmetry.     Motor: No atrophy.     Coordination: Coordination normal. Finger-Nose-Finger Test normal.     Gait: Gait normal.  Psychiatric:        Judgment: Judgment normal.     No results found for any visits on 03/19/24. Last CBC No results found for: "WBC", "HGB", "HCT", "MCV", "MCH", "RDW", "PLT" Last metabolic panel No results found for: "GLUCOSE", "NA", "K", "CL", "CO2", "BUN", "CREATININE", "EGFR", "CALCIUM", "PHOS", "PROT", "ALBUMIN", "LABGLOB", "AGRATIO", "BILITOT", "ALKPHOS", "AST", "ALT", "ANIONGAP"      Assessment & Plan:    Routine Health Maintenance and Physical Exam   There is no immunization history on file for this patient.  Health Maintenance  Topic  Date Due   HIV Screening  Never done   Hepatitis C Screening  Never done   DTaP/Tdap/Td (1 - Tdap) Never done   COVID-19 Vaccine (1 - 2024-25 season) Never done   INFLUENZA VACCINE  06/01/2024   HPV VACCINES  Aged Out  Meningococcal B Vaccine  Aged Out    Discussed health benefits of physical activity, and encouraged him to engage in regular exercise appropriate for his age and condition.  Encounter for general adult medical examination with abnormal findings Assessment & Plan: Physical exam as documented Discussed heart-healthy diet  Encouraged to Exercise: If you are able: 30 -60 minutes a day ,4 days a week, or 150 minutes a week. The longer the better. Combine stretch, strength, and aerobic activities Encourage to eat whole Food, Plant Predominant Nutrition is highly recommended: Eat Plenty of vegetables, Mushrooms, fruits, Legumes, Whole Grains, Nuts, seeds in lieu of processed meats, processed snacks/pastries red meat, poultry, eggs.  Will f/u in 1 year for CPE       Note: This chart has been completed using [employer] (consulting) software, and while attempts have been made to ensure accuracy, certain words and phrases may not be transcribed as intended.   Return in about 1 year (around 03/19/2025) for CPE.     Burns Timson, [name]

## 2024-03-20 LAB — LIPID PANEL
Chol/HDL Ratio: 4.9 ratio (ref 0.0–5.0)
Cholesterol, Total: 165 mg/dL (ref 100–199)
HDL: 34 mg/dL — ABNORMAL LOW (ref >39)
LDL Chol Calc (NIH): 114 mg/dL — ABNORMAL HIGH (ref 0–99)
Triglycerides: 88 mg/dL (ref 0–149)
VLDL Cholesterol Cal: 17 mg/dL (ref 5–40)

## 2024-03-20 LAB — CBC WITH DIFFERENTIAL/PLATELET
Basophils Absolute: 0 10*3/uL (ref 0.0–0.2)
Basos: 1 %
EOS (ABSOLUTE): 0.2 10*3/uL (ref 0.0–0.4)
Eos: 4 %
Hematocrit: 51 % (ref 37.5–51.0)
Hemoglobin: 17 g/dL (ref 13.0–17.7)
Immature Grans (Abs): 0 10*3/uL (ref 0.0–0.1)
Immature Granulocytes: 0 %
Lymphocytes Absolute: 1.7 10*3/uL (ref 0.7–3.1)
Lymphs: 36 %
MCH: 31.5 pg (ref 26.6–33.0)
MCHC: 33.3 g/dL (ref 31.5–35.7)
MCV: 94 fL (ref 79–97)
Monocytes Absolute: 0.5 10*3/uL (ref 0.1–0.9)
Monocytes: 11 %
Neutrophils Absolute: 2.2 10*3/uL (ref 1.4–7.0)
Neutrophils: 48 %
Platelets: 208 10*3/uL (ref 150–450)
RBC: 5.4 x10E6/uL (ref 4.14–5.80)
RDW: 12.6 % (ref 11.6–15.4)
WBC: 4.7 10*3/uL (ref 3.4–10.8)

## 2024-03-20 LAB — CMP14+EGFR
ALT: 83 IU/L — ABNORMAL HIGH (ref 0–44)
AST: 30 IU/L (ref 0–40)
Albumin: 4.8 g/dL (ref 4.3–5.2)
Alkaline Phosphatase: 75 IU/L (ref 44–121)
BUN/Creatinine Ratio: 17 (ref 9–20)
BUN: 21 mg/dL — ABNORMAL HIGH (ref 6–20)
Bilirubin Total: 0.4 mg/dL (ref 0.0–1.2)
CO2: 22 mmol/L (ref 20–29)
Calcium: 9.9 mg/dL (ref 8.7–10.2)
Chloride: 104 mmol/L (ref 96–106)
Creatinine, Ser: 1.24 mg/dL (ref 0.76–1.27)
Globulin, Total: 2.5 g/dL (ref 1.5–4.5)
Glucose: 91 mg/dL (ref 70–99)
Potassium: 4.7 mmol/L (ref 3.5–5.2)
Sodium: 140 mmol/L (ref 134–144)
Total Protein: 7.3 g/dL (ref 6.0–8.5)
eGFR: 80 mL/min/{1.73_m2} (ref >59)

## 2024-03-20 LAB — HEMOGLOBIN A1C
Est. average glucose Bld gHb Est-mCnc: 114 mg/dL
Hgb A1c MFr Bld: 5.6 % (ref 4.8–5.6)

## 2024-03-20 LAB — TSH+FREE T4
Free T4: 1.07 ng/dL (ref 0.82–1.77)
TSH: 1.12 u[IU]/mL (ref 0.450–4.500)

## 2024-03-20 LAB — VITAMIN D 25 HYDROXY (VIT D DEFICIENCY, FRACTURES): Vit D, 25-Hydroxy: 24.7 ng/mL — ABNORMAL LOW (ref 30.0–100.0)

## 2024-03-23 ENCOUNTER — Ambulatory Visit: Payer: Self-pay | Admitting: Family Medicine

## 2024-03-27 ENCOUNTER — Ambulatory Visit (HOSPITAL_COMMUNITY): Payer: Self-pay | Admitting: Certified Registered"

## 2024-03-27 ENCOUNTER — Other Ambulatory Visit: Payer: Self-pay

## 2024-03-27 ENCOUNTER — Ambulatory Visit (HOSPITAL_COMMUNITY)
Admission: RE | Admit: 2024-03-27 | Discharge: 2024-03-27 | Disposition: A | Discharge status: Home / Self Care | Attending: Gastroenterology | Admitting: Gastroenterology

## 2024-03-27 ENCOUNTER — Encounter (HOSPITAL_COMMUNITY): Payer: Self-pay | Admitting: Gastroenterology

## 2024-03-27 ENCOUNTER — Encounter (HOSPITAL_COMMUNITY)
Admission: RE | Disposition: A | Discharge status: Home / Self Care | Payer: Self-pay | Source: Home / Self Care | Attending: Gastroenterology

## 2024-03-27 DIAGNOSIS — G4730 Sleep apnea, unspecified: Secondary | ICD-10-CM | POA: Insufficient documentation

## 2024-03-27 DIAGNOSIS — K2970 Gastritis, unspecified, without bleeding: Secondary | ICD-10-CM

## 2024-03-27 DIAGNOSIS — K3189 Other diseases of stomach and duodenum: Secondary | ICD-10-CM | POA: Insufficient documentation

## 2024-03-27 DIAGNOSIS — K200 Eosinophilic esophagitis: Secondary | ICD-10-CM | POA: Insufficient documentation

## 2024-03-27 DIAGNOSIS — I890 Lymphedema, not elsewhere classified: Secondary | ICD-10-CM | POA: Diagnosis not present

## 2024-03-27 DIAGNOSIS — K2990 Gastroduodenitis, unspecified, without bleeding: Secondary | ICD-10-CM | POA: Diagnosis not present

## 2024-03-27 DIAGNOSIS — Z79899 Other long term (current) drug therapy: Secondary | ICD-10-CM | POA: Insufficient documentation

## 2024-03-27 DIAGNOSIS — K219 Gastro-esophageal reflux disease without esophagitis: Secondary | ICD-10-CM | POA: Diagnosis not present

## 2024-03-27 DIAGNOSIS — K2100 Gastro-esophageal reflux disease with esophagitis, without bleeding: Secondary | ICD-10-CM | POA: Insufficient documentation

## 2024-03-27 HISTORY — PX: BRAVO PH STUDY: SHX5421

## 2024-03-27 HISTORY — PX: ESOPHAGOGASTRODUODENOSCOPY: SHX5428

## 2024-03-27 SURGERY — EGD (ESOPHAGOGASTRODUODENOSCOPY)
Anesthesia: General

## 2024-03-27 MED ORDER — LIDOCAINE 2% (20 MG/ML) 5 ML SYRINGE
INTRAMUSCULAR | Status: DC | PRN
Start: 2024-03-27 — End: 2024-03-27
  Administered 2024-03-27: 100 mg via INTRAVENOUS

## 2024-03-27 MED ORDER — DEXMEDETOMIDINE HCL IN NACL 80 MCG/20ML IV SOLN
INTRAVENOUS | Status: DC | PRN
  Administered 2024-03-27: 8 ug via INTRAVENOUS

## 2024-03-27 MED ORDER — PROPOFOL 500 MG/50ML IV EMUL
INTRAVENOUS | Status: DC | PRN
Start: 2024-03-27 — End: 2024-03-27
  Administered 2024-03-27: 250 ug/kg/min via INTRAVENOUS

## 2024-03-27 MED ORDER — LACTATED RINGERS IV SOLN: INTRAVENOUS | Status: DC

## 2024-03-27 MED ORDER — PROPOFOL 10 MG/ML IV BOLUS
INTRAVENOUS | Status: DC | PRN
  Administered 2024-03-27: 100 mg via INTRAVENOUS
  Administered 2024-03-27: 50 mg via INTRAVENOUS
  Administered 2024-03-27: 100 mg via INTRAVENOUS

## 2024-03-27 NOTE — Discharge Instructions (Signed)

## 2024-03-27 NOTE — Anesthesia Preprocedure Evaluation (Signed)
Anesthesia Evaluation  Patient identified by MRN, date of birth, ID band Patient awake    Reviewed: Allergy & Precautions, H&P , NPO status , Patient's Chart, lab work & pertinent test results, reviewed documented beta blocker date and time   Airway Mallampati: II  TM Distance: >3 FB Neck ROM: full    Dental no notable dental hx.    Pulmonary neg pulmonary ROS, sleep apnea    Pulmonary exam normal breath sounds clear to auscultation       Cardiovascular Exercise Tolerance: Good hypertension, negative cardio ROS  Rhythm:regular Rate:Normal     Neuro/Psych negative neurological ROS  negative psych ROS   GI/Hepatic negative GI ROS, Neg liver ROS,GERD  ,,  Endo/Other  negative endocrine ROS    Renal/GU negative Renal ROS  negative genitourinary   Musculoskeletal   Abdominal   Peds  Hematology negative hematology ROS (+)   Anesthesia Other Findings   Reproductive/Obstetrics negative OB ROS                             Anesthesia Physical Anesthesia Plan  ASA: 2  Anesthesia Plan: General   Post-op Pain Management:    Induction:   PONV Risk Score and Plan: Propofol infusion  Airway Management Planned:   Additional Equipment:   Intra-op Plan:   Post-operative Plan:   Informed Consent: I have reviewed the patients History and Physical, chart, labs and discussed the procedure including the risks, benefits and alternatives for the proposed anesthesia with the patient or authorized representative who has indicated his/her understanding and acceptance.     Dental Advisory Given  Plan Discussed with: CRNA  Anesthesia Plan Comments:        Anesthesia Quick Evaluation

## 2024-03-27 NOTE — Anesthesia Procedure Notes (Signed)
 Date/Time: 03/27/2024 11:01 AM  Performed by: Sherwin Donate, CRNAPre-anesthesia Checklist: Patient identified, Emergency Drugs available, Suction available and Patient being monitored Patient Re-evaluated:Patient Re-evaluated prior to induction Oxygen Delivery Method: Nasal cannula Induction Type: IV induction Placement Confirmation: positive ETCO2 Comments: Optiflow High Flow [place] O2 used.

## 2024-03-27 NOTE — Transfer of Care (Signed)
 Immediate Anesthesia Transfer of Care Note  Patient: Mitchell Cochran  Procedure(s) Performed: EGD (ESOPHAGOGASTRODUODENOSCOPY) PH MONITORING, ESOPHAGUS, WIRELESS  Patient Location: Endoscopy Unit  Anesthesia Type:General  Level of Consciousness: drowsy  Airway & Oxygen Therapy: Patient Spontanous Breathing  Post-op Assessment: Report given to RN and Post -op Vital signs reviewed and stable  Post vital signs: Reviewed and stable  Last Vitals:  Vitals Value Taken Time  BP    Temp    Pulse    Resp    SpO2      Last Pain:  Vitals:   03/27/24 1102  TempSrc:   PainSc: 0-No pain      Patients Stated Pain Goal: 7 (03/27/24 0918)  Complications: No notable events documented.

## 2024-03-27 NOTE — Op Note (Signed)
 [facility] Patient Name: Mitchell Cochran Procedure Date: 03/27/2024 10:53 AM MRN: [id number] Date of Birth: 1996-07-05 Attending MD: Terril Fetters , MD, [id number] CSN: [id number] Age: 28 Admit Type: Outpatient Procedure:                Upper GI endoscopy Indications:              Heartburn, Esophageal reflux symptoms that persist                            despite appropriate therapy Providers:                Terril Fetters, MD, Alisa App, Crystal Page,                            Annell Barrow Referring MD:              Medicines:                Monitored Anesthesia Care Complications:            No immediate complications. Estimated Blood Loss:     Estimated blood loss was minimal. Procedure:                Pre-Anesthesia Assessment:                           - Prior to the procedure, a History and Physical                            was performed, and patient medications and                            allergies were reviewed. The patient's tolerance of                            previous anesthesia was also reviewed. The risks                            and benefits of the procedure and the sedation                            options and risks were discussed with the patient.                            All questions were answered, and informed consent                            was obtained. Prior Anticoagulants: The patient has                            taken no anticoagulant or antiplatelet agents. ASA                            Grade Assessment: II - A patient with mild systemic  disease. After reviewing the risks and benefits,                            the patient was deemed in satisfactory condition to                            undergo the procedure.                           After obtaining informed consent, the endoscope was                            passed under direct vision. Throughout the                            procedure, the  patient's blood pressure, pulse, and                            oxygen saturations were monitored continuously. The                            [id number]-H190 ([id number]) scope was introduced through the                            mouth, and advanced to the second part of duodenum.                            The upper GI endoscopy was accomplished without                            difficulty. The patient tolerated the procedure                            well. Scope In: 11:12:40 AM Scope Out: 11:21:58 AM Total Procedure Duration: 0 hours 9 minutes 18 seconds  Findings:      There is no endoscopic evidence of Barrett's esophagus, esophagitis or       stenosis in the entire esophagus. Biopsies were obtained from the       proximal and distal esophagus with cold forceps for histology of       suspected eosinophilic esophagitis. The BRAVO capsule with delivery       system was introduced through the mouth and advanced into the esophagus,       such that the BRAVO pH capsule was positioned 34 cm from the incisors,       which was 6 cm proximal to the GE junction. The BRAVO pH capsule was       then deployed and attached to the esophageal mucosa. The delivery system       was then withdrawn. Endoscopy was utilized for probe placement and       diagnostic evaluation.      The gastroesophageal flap valve was visualized endoscopically and       classified as Hill Grade I (prominent fold, tight to endoscope).      Esophagogastric landmarks were identified: the Z-line was found at 40 cm       and  the lower esophageal sphincter was found at 40 cm from the incisors.      Mildly erythematous mucosa without bleeding was found in the gastric       antrum.      Lymphangiectasia was present in the duodenal bulb and in the second       portion of the duodenum. Biopsies for histology were taken with a cold       forceps for evaluation of celiac disease. Impression:               - Gastroesophageal flap valve  classified as Hill                            Grade I (prominent fold, tight to endoscope).                           - Esophagogastric landmarks identified.                           - Erythematous mucosa in the antrum.                           - Duodenal mucosal lymphangiectasia.                           - Biopsies were taken with a cold forceps for                            evaluation of eosinophilic esophagitis.                           - The BRAVO pH capsule was deployed. Moderate Sedation:      Per Anesthesia Care Recommendation:           - Patient has a contact number available for                            emergencies. The signs and symptoms of potential                            delayed complications were discussed with the                            patient. Return to normal activities tomorrow.                            Written discharge instructions were provided to the                            patient.                           - Resume previous diet.                           - Continue present medications.                           -  Await pathology results.                           -post BRAVO instruction ( OFF PPI) Procedure Code(s):        --- Professional ---                           [phone], Esophagogastroduodenoscopy, flexible,                            transoral; with biopsy, single or multiple Diagnosis Code(s):        --- Professional ---                           K31.89, Other diseases of stomach and duodenum                           I89.0, Lymphedema, not elsewhere classified                           R12, Heartburn                           K21.9, Gastro-esophageal reflux disease without                            esophagitis CPT copyright 2022 American Medical Association. All rights reserved. The codes documented in this report are preliminary and upon coder review may  be revised to meet current compliance requirements. Terril Fetters,  MD Terril Fetters, MD 03/27/2024 11:29:47 AM This report has been signed electronically. Number of Addenda: 0

## 2024-03-27 NOTE — Interval H&P Note (Signed)
 History and Physical Interval Note:  03/27/2024 9:45 AM  Mitchell Cochran  has presented today for surgery, with the diagnosis of GERD.  The various methods of treatment have been discussed with the patient and family. After consideration of risks, benefits and other options for treatment, the patient has consented to  Procedure(s) with comments: EGD (ESOPHAGOGASTRODUODENOSCOPY) (N/A) - 10:45AM;ASA 1-2 - Bravo PH MONITORING, ESOPHAGUS, WIRELESS (N/A) - 10:45AM;ASA 1-2 as a surgical intervention.  The patient's history has been reviewed, patient examined, no change in status, stable for surgery.  I have reviewed the patient's chart and labs.  Questions were answered to the patient's satisfaction.     [name]

## 2024-03-28 ENCOUNTER — Encounter (HOSPITAL_COMMUNITY): Payer: Self-pay | Admitting: Gastroenterology

## 2024-03-28 LAB — SURGICAL PATHOLOGY

## 2024-03-30 NOTE — Anesthesia Postprocedure Evaluation (Signed)
 Anesthesia Post Note  Patient: Mitchell Cochran  Procedure(s) Performed: EGD (ESOPHAGOGASTRODUODENOSCOPY) PH MONITORING, ESOPHAGUS, WIRELESS  Patient location during evaluation: Phase II Anesthesia Type: General Level of consciousness: awake Pain management: pain level controlled Vital Signs Assessment: post-procedure vital signs reviewed and stable Respiratory status: spontaneous breathing and respiratory function stable Cardiovascular status: blood pressure returned to baseline and stable Postop Assessment: no headache and no apparent nausea or vomiting Anesthetic complications: no Comments: Late entry   No notable events documented.   Last Vitals:  Vitals:   03/27/24 0925 03/27/24 1127  BP: 132/89 107/62  Pulse: 92 82  Resp: 19 (!) 21  Temp: 36.8 C 37.1 C  SpO2: 97% 94%    Last Pain:  Vitals:   03/27/24 1127  TempSrc: Oral  PainSc: 0-No pain                 Coretha Dew

## 2024-04-02 ENCOUNTER — Ambulatory Visit (INDEPENDENT_AMBULATORY_CARE_PROVIDER_SITE_OTHER): Payer: Self-pay | Admitting: Gastroenterology

## 2024-04-02 MED ORDER — OMEPRAZOLE 40 MG PO CPDR: 40.0000 mg | DELAYED_RELEASE_CAPSULE | Freq: Two times a day (BID) | ORAL | 1 refills | Status: DC

## 2024-04-02 NOTE — Procedures (Signed)
 BRAVO and Pathology results  Pathology   A. DUODENUM, BIOPSY:  - Small intestinal mucosa with focal non-specific reactive changes.  - Negative for features of celiac, dysplasia, and malignancy.   B. STOMACH, BIOPSY:  - Antral mucosa with reactive gastritis.  - Oxyntic mucosa with focal superficial vascular congestion,  non-specific.  - Negative for H. pylori, intestinal metaplasia, dysplasia, and  malignancy.   C. ESOPHAGUS, BIOPSY:  - Squamous mucosa with spongiosis and intraepithelial eosinophils  involving all biopsy fragments (up to 45 eosinophils in a high-power  field), see comment.  - Negative for dysplasia and malignancy.   Comment:  The findings in the esophageal biopsies (specimen C) are compatible with  eosinophilic esophagitis in the appropriate clinical setting.    BRAVO Study results      Procedure Description  The study was performed with the BRAVO pH capsule telemetry system. A routine upper endoscopy was performed and then BRAVO wireless pH telemetry probe was placed under endoscopic guidance 6 cm proximal to the Z line. The patient was then discharged home and kept a symptom diary for 96HRs, afterwhich the wireless telemetry receiver and diary were brought back to the endoscopy unit. The information was then downloaded and analyzed.     Indications  Refractory GERD     Interpretations   This was an abnormal study performed off of acid suppressive therapy. There was increased esophageal acid exposure on Day 1 (6.1%),  Day 2 (8.6%), Day 3 (6.5%) but physiological on  Day 4 (1.6%) . Total AET is 5.9 but had positive study 3/4 days (AET>6.0%) The DeMeester Score was elevated at 22.5 for all days combined. There was good symptom correlation for symptoms of heartburn (50%)  with SAP 98.6. This suggest that patient has acid mediated GERD.  Clinical correlation is recommended.    Recommendation   I spoke with patient wife over the phone as patient was at  work Patient does have intermittent dysphagia as per the wife .  Biopsies positive for EOE and BRAVO testing is positive for acid mediated GERD.  Eosinophilia can be seen in GERD as well , but we will treat this as both separate entities for now and will follow up with repeat EGD to evaluate response to PPI as firstline for EOE treatment   I recommend omperazole BID for atleast 8 weeks and repeat EGD thereafter with proximal and distal esophageal biopsies   Avoid cow mild protein   Follow up appointment in GI clinic made

## 2024-04-02 NOTE — Progress Notes (Signed)
 I reviewed the pathology results. Mitchell Cochran, can you send her a letter with the findings as described below please? Repeat upper endoscopy in 8 -12 weeks  I spoke with patient wife over the phone as well   Thanks,  Hema Lanza Faizan Brewer Hitchman, MD Gastroenterology and Hepatology [facility]  ---------------------------------------------------------------------------------------------  [facility] [address], [address], [place], [place] [address] Phone:  [phone]   04/02/24 Selene Dais, [place]   Dear Darletta Ehrich,  I am writing to inform you that the biopsies taken during your recent endoscopic examination showed:  A. DUODENUM, BIOPSY:  - Small intestinal mucosa with focal non-specific reactive changes.  - Negative for features of celiac, dysplasia, and malignancy.   B. STOMACH, BIOPSY:  - Antral mucosa with reactive gastritis.  - Oxyntic mucosa with focal superficial vascular congestion,  non-specific.  - Negative for H. pylori, intestinal metaplasia, dysplasia, and  malignancy.   C. ESOPHAGUS, BIOPSY:  - Squamous mucosa with spongiosis and intraepithelial eosinophils  involving all biopsy fragments (up to 45 eosinophils in a high-power  field), see comment.  - Negative for dysplasia and malignancy.   Comment:  The findings in the esophageal biopsies (specimen C) are compatible with  eosinophilic esophagitis in the appropriate clinical setting.    What does this mean?   You have been diagnosed with Eosinophilic Esophagitis (EOE), a chronic inflammatory condition where eosinophils, a type of white blood cell, accumulate in the lining of your esophagus. This build-up can lead to symptoms such as difficulty swallowing, food getting stuck, and chest pain. EOE is often related to food allergies or other environmental factors. Treatment typically  involves dietary changes, such as eliminating specific allergens, and medications like proton pump inhibitors or topical steroids to reduce inflammation and manage symptoms. We will work closely with you to develop a personalized treatment plan to help you manage this condition effectively.  For now I would recommend  eight-week course of treatment of PPI twice daily (omeprazole ) . We perform upper endoscopy after 8 to 12 weeks of therapy to assess endoscopic and histologic response.  I look forward to seeing you in the clinic   Also I value your feedback , so if you get a survey , please take the time to fill it out and thank you for choosing [facility]  Please call us  at [phone] if you have persistent problems or have questions about your condition that have not been fully answered at this time.  Sincerely,  Alesi Zachery Faizan Reika Callanan, MD Gastroenterology and Hepatology

## 2024-04-16 ENCOUNTER — Ambulatory Visit: Admitting: Family Medicine

## 2024-04-17 ENCOUNTER — Ambulatory Visit (INDEPENDENT_AMBULATORY_CARE_PROVIDER_SITE_OTHER): Admitting: Gastroenterology

## 2024-04-30 ENCOUNTER — Encounter (INDEPENDENT_AMBULATORY_CARE_PROVIDER_SITE_OTHER): Payer: Self-pay | Admitting: Gastroenterology

## 2024-04-30 ENCOUNTER — Ambulatory Visit (INDEPENDENT_AMBULATORY_CARE_PROVIDER_SITE_OTHER): Admitting: Gastroenterology

## 2024-04-30 VITALS — BP 107/71 | HR 97 | Temp 98.2°F | Ht 72.0 in | Wt 240.8 lb

## 2024-04-30 DIAGNOSIS — K219 Gastro-esophageal reflux disease without esophagitis: Secondary | ICD-10-CM

## 2024-04-30 DIAGNOSIS — K200 Eosinophilic esophagitis: Secondary | ICD-10-CM | POA: Diagnosis not present

## 2024-04-30 DIAGNOSIS — Z6831 Body mass index (BMI) 31.0-31.9, adult: Secondary | ICD-10-CM

## 2024-04-30 MED ORDER — OMEPRAZOLE 40 MG PO CPDR: 40.0000 mg | DELAYED_RELEASE_CAPSULE | Freq: Two times a day (BID) | ORAL | 1 refills | Status: AC

## 2024-04-30 NOTE — Patient Instructions (Signed)
 It was very nice to meet you today, as dicussed with will plan for the following :  Omeprazole  40mg  , twice daily  Avoid cow mild protein   Upper endoscopy in 6 weeks

## 2024-04-30 NOTE — Progress Notes (Signed)
 Luv Mish Mitchell Xoie Kreuser , M.D. Gastroenterology & Hepatology [facility] [address] [place], [place] [address] Primary Care Physician: Zarwolo, Gloria, [name] [address] [place] [address]  Chief Complaint:GERD /EOE   History of Present Illness: Mitchell Cochran is a 28 y.o. male with sleep apnea who presents for follow-up of newly diagnosed eosinophilic esophagitis and GERD  Patient recently had upper endoscopy with Bravo and esophageal biopsies.  Bravo was positive for acid mediated GERD and esophageal biopsies demonstrated significant eosinophilia  Since procedure patient has been taking omeprazole  40 mg twice daily and his symptoms of heartburn has significantly improved.  Patient reports that intermittently he used to have solid food dysphagia but nothing too significant  Patient initial complaints were burning sensation in his throat which would wake up in the middle of the night choking. The patient denies having any nausea, vomiting, fever, chills, hematochezia, melena, hematemesis, abdominal distention, abdominal pain, diarrhea, jaundice, pruritus or weight loss.  Last EGD:03/2024  - Gastroesophageal flap valve classified as Hill Grade I ( prominent fold, tight to endoscope) . - Esophagogastric landmarks identified. - Erythematous mucosa in the antrum. - Duodenal mucosal lymphangiectasia. - Biopsies were taken with a cold forceps for evaluation of eosinophilic esophagitis. - The BRAVO pH capsule was deployed.  A. DUODENUM, BIOPSY:  - Small intestinal mucosa with focal non-specific reactive changes.  - Negative for features of celiac, dysplasia, and malignancy.   B. STOMACH, BIOPSY:  - Antral mucosa with reactive gastritis.  - Oxyntic mucosa with focal superficial vascular congestion,  non-specific.  - Negative for H. pylori, intestinal metaplasia, dysplasia, and  malignancy.   C. ESOPHAGUS, BIOPSY:  - Squamous mucosa with  spongiosis and intraepithelial eosinophils  involving all biopsy fragments (up to 45 eosinophils in a high-power  field), see comment.  - Negative for dysplasia and malignancy.   Comment:  The findings in the esophageal biopsies (specimen C) are compatible with  eosinophilic esophagitis in the appropriate clinical setting.   BRAVO testing   This was an abnormal study performed off of acid suppressive therapy. There was increased esophageal acid exposure on Day 1 (6.1%), Day 2 (8.6%), Day 3 (6.5%) but physiological on Day 4 (1.6%) . Total AET is 5.9 but had positive study 3/4 days (AET>6.0%) The DeMeester Score was elevated at 22.5 for all days combined. There was good symptom correlation for symptoms of heartburn (50%) with SAP 98.6. This suggest that patient has acid mediated GERD. Clinical correlation is recommended.   Last Colonoscopy:none  FHx: neg for any gastrointestinal/liver disease, no malignancies Social: neg smoking, alcohol or illicit drug use Surgical: no abdominal surgeries  Past Medical History: Past Medical History:  Diagnosis Date   GERD (gastroesophageal reflux disease)    Sleep apnea     Past Surgical History: Past Surgical History:  Procedure Laterality Date   BRAVO PH STUDY N/A 03/27/2024   Procedure: PH MONITORING, ESOPHAGUS, WIRELESS;  Surgeon: Cinderella Deatrice FALCON, MD;  Location: [facility];  Service: Endoscopy;  Laterality: N/A;  10:45AM;ASA 1-2   ESOPHAGOGASTRODUODENOSCOPY N/A 03/27/2024   Procedure: EGD (ESOPHAGOGASTRODUODENOSCOPY);  Surgeon: Cinderella Deatrice FALCON, MD;  Location: [facility];  Service: Endoscopy;  Laterality: N/A;  10:45AM;ASA 1-2 - Bravo    Family History: Family History  Problem Relation Age of Onset   Diabetes Mother    Diabetes Father     Social History: Social History   Tobacco Use  Smoking Status Never   Passive exposure:  Never  Smokeless Tobacco Current   Types: Snuff   Social History   Substance and Sexual Activity   Alcohol Use No   Social History   Substance and Sexual Activity  Drug Use No    Allergies: Allergies  Allergen Reactions   Bee Venom Anaphylaxis    Medications: Current Outpatient Medications  Medication Sig Dispense Refill   omeprazole  (PRILOSEC) 40 MG capsule Take 1 capsule (40 mg total) by mouth in the morning and at bedtime. 60 capsule 1   No current facility-administered medications for this visit.    Review of Systems: GENERAL: negative for malaise, night sweats HEENT: No changes in hearing or vision, no nose bleeds or other nasal problems. NECK: Negative for lumps, goiter, pain and significant neck swelling RESPIRATORY: Negative for cough, wheezing CARDIOVASCULAR: Negative for chest pain, leg swelling, palpitations, orthopnea GI: SEE HPI MUSCULOSKELETAL: Negative for joint pain or swelling, back pain, and muscle pain. SKIN: Negative for lesions, rash HEMATOLOGY Negative for prolonged bleeding, bruising easily, and swollen nodes. ENDOCRINE: Negative for cold or heat intolerance, polyuria, polydipsia and goiter. NEURO: negative for tremor, gait imbalance, syncope and seizures. The remainder of the review of systems is noncontributory.   Physical Exam: BP 107/71   Pulse 97   Temp 98.2 F (36.8 C)   Ht 6' (1.829 m)   Wt 240 lb 12.8 oz (109.2 kg)   BMI 32.66 kg/m  GENERAL: The patient is AO x3, in no acute distress. HEENT: Head is normocephalic and atraumatic. EOMI are intact. Mouth is well hydrated and without lesions. NECK: Supple. No masses LUNGS: Clear to auscultation. No presence of rhonchi/wheezing/rales. Adequate chest expansion HEART: RRR, normal s1 and s2. ABDOMEN: Soft, nontender, no guarding, no peritoneal signs, and nondistended. BS +. No masses.   Imaging/Labs: as above     Latest Ref Rng & Units 03/19/2024    8:33 AM  CBC  WBC 3.4 - 10.8 x10E3/uL 4.7   Hemoglobin 13.0 - 17.7 g/dL 82.9   Hematocrit 62.4 - 51.0 % 51.0   Platelets 150 -  450 x10E3/uL 208    No results found for: IRON, TIBC, FERRITIN  I personally reviewed and interpreted the available labs, imaging and endoscopic files.  Impression and Plan:  Mitchell Cochran is a 28 y.o. male with sleep apnea who presents for follow-up of newly diagnosed eosinophilic esophagitis and GERD  #Eosinophilic esophagitis  #GERD   Biopsies positive for EOE and BRAVO testing is positive for acid mediated GERD.  Eosinophilia can be seen in GERD as well , but we will treat this as both separate entities for now and will follow up with repeat EGD to evaluate response to PPI as firstline for EOE treatment   Patient with biopsy-proven EOE with eosinophills  burden of up to 45 eosinophils in a high-power  field  Recommendation:  The endpoints of therapy of EoE include improvements in clinical symptoms and esophageal eosinophilic inflammation. While complete resolution of symptoms and pathology is an ideal endpoint, acceptance of a range of reductions in symptoms and histology is a more realistic and practical goal in clinical practice. (Recommendation conditional, evidence low)  Patient was extensively  counseled about the high likelihood of symptom recurrence after discontinuing treatment due to the chronic nature of this disease. (Recommendation strong, moderate evidence)   The overall goal of maintenance therapy is to minimize symptoms and prevent complications of EoE, preserve quality of life, with minimal long-term adverse effects of treatments. (Recommendation conditional, evidence low)  I recommend omperazole BID for atleast 8 weeks and repeat EGD thereafter with proximal and distal esophageal biopsies    Avoid cow milk protein    All questions were answered.      Mitchell Cochran Mitchell Ariyah Sedlack, MD Gastroenterology and Hepatology [facility]   This chart has been completed using [facility] Dictation software, and while attempts have been  made to ensure accuracy , certain words and phrases may not be transcribed as intended

## 2024-05-09 ENCOUNTER — Telehealth: Payer: Self-pay | Admitting: *Deleted

## 2024-05-09 NOTE — Telephone Encounter (Signed)
 LMOVM to call back to schedule EGD w/ bx in 6 weeks from last OV

## 2024-05-16 ENCOUNTER — Other Ambulatory Visit: Payer: Self-pay

## 2024-05-16 ENCOUNTER — Encounter (HOSPITAL_COMMUNITY): Payer: Self-pay

## 2024-05-16 ENCOUNTER — Emergency Department (HOSPITAL_COMMUNITY)
Admission: EM | Admit: 2024-05-16 | Discharge: 2024-05-16 | Disposition: A | Discharge status: Home / Self Care | Payer: Worker's Compensation | Attending: Emergency Medicine | Admitting: Emergency Medicine

## 2024-05-16 DIAGNOSIS — T782XXA Anaphylactic shock, unspecified, initial encounter: Secondary | ICD-10-CM | POA: Diagnosis not present

## 2024-05-16 DIAGNOSIS — T7840XA Allergy, unspecified, initial encounter: Secondary | ICD-10-CM | POA: Diagnosis present

## 2024-05-16 MED ORDER — PREDNISONE 20 MG PO TABS
40.0000 mg | ORAL_TABLET | Freq: Every day | ORAL | 0 refills | Status: AC
Start: 2024-05-16 — End: 2024-05-21

## 2024-05-16 MED ORDER — METHYLPREDNISOLONE SODIUM SUCC 125 MG IJ SOLR
125.0000 mg | Freq: Once | INTRAMUSCULAR | Status: AC
  Administered 2024-05-16: 125 mg via INTRAVENOUS
  Filled 2024-05-16: qty 2

## 2024-05-16 MED ORDER — SODIUM CHLORIDE 0.9 % IV BOLUS
1000.0000 mL | Freq: Once | INTRAVENOUS | Status: AC
  Administered 2024-05-16: 1000 mL via INTRAVENOUS

## 2024-05-16 MED ORDER — EPINEPHRINE 0.3 MG/0.3ML IJ SOAJ: 0.3000 mg | INTRAMUSCULAR | 0 refills | Status: AC | PRN

## 2024-05-16 MED ORDER — FAMOTIDINE IN NACL 20-0.9 MG/50ML-% IV SOLN
20.0000 mg | Freq: Once | INTRAVENOUS | Status: AC
  Administered 2024-05-16: 20 mg via INTRAVENOUS
  Filled 2024-05-16: qty 50

## 2024-05-16 NOTE — Discharge Instructions (Addendum)
 You are seen today for a severe allergic reaction.  Fortunately your symptoms have gotten better with the epinephrine .  We will have you pick up prescription for EpiPen  to carry with you at all times in case of another bee sting.  This is important to use if you have severe allergic reaction like today with throat swelling, dizziness or other severe symptoms.  We are prescribing you steroids as well, you can take Benadryl or Zyrtec over-the-counter as needed for any itching.  Follow-up close with your PCP.  Come back to the ER for new or worsening symptoms.  Rate is slightly elevated likely due to all the medications you received today, drink plenty of fluids, if you get dizzy, develop chest pain or shortness of breath come back to the ER right away.  This should be back to normal by tomorrow if you are having persistent fast heart (>100 bpm) rate you should be evaluated again.

## 2024-05-16 NOTE — ED Provider Notes (Signed)
  Physical Exam  BP (!) 117/59   Pulse (!) 119   Temp 98.6 F (37 C) (Oral)   Resp (!) 22   Ht 6' (1.829 m)   Wt 108.9 kg   SpO2 95%   BMI 32.55 kg/m   Physical Exam  Procedures  Procedures  ED Course / MDM    Medical Decision Making Risk Prescription drug management.   Signout taken from [name], PA-C.  Patient was seen here for anaphylaxis and got a total of 0.8 of epi IM prior to arrival after a bee sting with some throat swelling and itching and rash.  Symptoms are fully resolved in the ER, he is still tachycardic, I reevaluated him he is having no palpitations, chest pain, shortness of breath or dizziness.  Is likely due to the epinephrine .  Advised to keep an eye and this should be back to normal by tomorrow.  Discussed follow-up and return precautions.  Will send home with EpiPen  and 5-day steroid burst.  Patient is agreeable plan of care and discharge.  HR 111 on my evaluation.        Mitchell Cochran 05/16/24 1954    Mitchell Elsie CROME, MD 05/17/24 [phone]

## 2024-05-16 NOTE — ED Triage Notes (Signed)
 Pt arrived via [facility] c/o a three bee stings in total while at work. Pt is allergic to bees and did receive 0.3 mg of epi by the fire dept, when EMS arrived, pt stated his throat still felt scratchy so EMS gave and additional; dose of epi 0.5 mg. EMS also gave 50 mg of IV benadryl, pt states he feels better after receiving medications listed above. Pt is tachycardic per EMS

## 2024-05-16 NOTE — ED Provider Notes (Signed)
 [place] EMERGENCY DEPARTMENT AT [facility] Provider Note   CSN: [id number] Arrival date & time: 05/16/24  1549     Patient presents with: Allergic Reaction   Mitchell Cochran is a 28 y.o. male.   Patient is a 28 year old male who presents emergency department secondary to allergic reaction.  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  He notes that he was stung approximately 5 times.  He was evaluated by EMS and transported to the emergency department.  EMS did give him 0.3 and 0.5 mg of epinephrine .  He was also given 50 mg of Benadryl.  On presentation he notes that he is feeling better.  He notes initially he was experiencing rash and swelling to his throat.  He denies any active chest pain, shortness of breath, abdominal pain, nausea, vomiting, diarrhea.   Allergic Reaction Presenting symptoms: rash        Prior to Admission medications   Medication Sig Start Date End Date Taking? Authorizing Provider  omeprazole  (PRILOSEC) 40 MG capsule Take 1 capsule (40 mg total) by mouth in the morning and at bedtime. 04/30/24 06/29/24  Cinderella Deatrice FALCON, MD    Allergies: Bee venom    Review of Systems  Skin:  Positive for rash.  All other systems reviewed and are negative.   Updated Vital Signs BP 104/70   Pulse (!) 121   Temp 98.6 F (37 C) (Oral)   Resp (!) 26   Ht 6' (1.829 m)   Wt 108.9 kg   SpO2 92%   BMI 32.55 kg/m   Physical Exam Vitals and nursing note reviewed.  Constitutional:      Appearance: Normal appearance.  HENT:     Head: Normocephalic and atraumatic.     Nose: Nose normal.     Mouth/Throat:     Mouth: Mucous membranes are moist.     Pharynx: No posterior oropharyngeal erythema.     Comments: No oral swelling Eyes:     Extraocular Movements: Extraocular movements intact.     Conjunctiva/sclera: Conjunctivae normal.     Pupils: Pupils are equal, round, and reactive to light.   Cardiovascular:     Rate and Rhythm: Normal rate and regular rhythm.     Pulses: Normal pulses.     Heart sounds: Normal heart sounds. No murmur heard.    No gallop.  Pulmonary:     Effort: Pulmonary effort is normal. No respiratory distress.     Breath sounds: Normal breath sounds. No stridor. No wheezing, rhonchi or rales.  Abdominal:     General: Abdomen is flat. Bowel sounds are normal. There is no distension.     Palpations: Abdomen is soft.     Tenderness: There is no abdominal tenderness. There is no guarding.  Musculoskeletal:        General: Normal range of motion.     Cervical back: Normal range of motion and neck supple. No rigidity or tenderness.  Skin:    General: Skin is warm and dry.     Comments: Hives noted to bilateral forearms and face  Neurological:     General: No focal deficit present.     Mental Status: He is alert and oriented to person, place, and time. Mental status is at baseline.     Cranial Nerves: No cranial nerve deficit.     Sensory: No sensory deficit.     Motor: No weakness.  Coordination: Coordination normal.     Gait: Gait normal.  Psychiatric:        Mood and Affect: Mood normal.        Behavior: Behavior normal.        Thought Content: Thought content normal.        Judgment: Judgment normal.     (all labs ordered are listed, but only abnormal results are displayed) Labs Reviewed - No data to display  EKG: None  Radiology: No results found.   Procedures   Medications Ordered in the ED  sodium chloride  0.9 % bolus 1,000 mL (1,000 mLs Intravenous New Bag/Given 05/16/24 1605)  methylPREDNISolone  sodium succinate (SOLU-MEDROL ) 125 mg/2 mL injection 125 mg (125 mg Intravenous Given 05/16/24 1603)  famotidine  (PEPCID ) IVPB 20 mg premix (0 mg Intravenous Stopped 05/16/24 1657)                                    Medical Decision Making Risk Prescription drug management.   This patient presents to the ED for concern of rash,  difficulty breathing differential diagnosis includes anaphylaxis, allergic reaction    Additional history obtained:  Additional history obtained from EMS External records from outside source obtained and reviewed including none    Medicines ordered and prescription drug management:  I ordered medication including Solu-Medrol , Pepcid , IV fluids for anaphylaxis Reevaluation of the patient after these medicines showed that the patient improved I have reviewed the patients home medicines and have made adjustments as needed   Problem List / ED Course:  Patient does remain stable at this time and has no further acute complaints.  He notes that his rash and difficulty breathing has resolved.  Will continue to monitor in the emergency department.  He does remain tachycardic at this time but was given 2 doses of epinephrine .  He is currently receiving IV fluids.  EKG demonstrated a sinus tachycardia with no arrhythmia.  Will sign patient out to [facility], PA-C at 1900 on 05/16/24 pending reevaluation and final dispo.   Social Determinants of Health:  None        Final diagnoses:  None    ED Discharge Orders     None          Mitchell Cochran 05/16/24 1854    Francesca Elsie CROME, MD 05/17/24 [phone]

## 2024-05-23 NOTE — Telephone Encounter (Signed)
 LMTCB. Letter mailed ?

## 2024-06-12 ENCOUNTER — Encounter (INDEPENDENT_AMBULATORY_CARE_PROVIDER_SITE_OTHER): Payer: Self-pay | Admitting: *Deleted

## 2024-10-07 ENCOUNTER — Encounter: Payer: Self-pay | Admitting: Family Medicine

## 2024-10-08 ENCOUNTER — Ambulatory Visit: Admit: 2024-10-08 | Discharge: 2024-10-09 | Disposition: A

## 2024-10-08 DIAGNOSIS — J069 Acute upper respiratory infection, unspecified: Secondary | ICD-10-CM

## 2024-10-08 DIAGNOSIS — R509 Fever, unspecified: Secondary | ICD-10-CM

## 2024-10-08 DIAGNOSIS — J4521 Mild intermittent asthma with (acute) exacerbation: Secondary | ICD-10-CM

## 2024-10-08 MED ORDER — ALBUTEROL SULFATE HFA 108 (90 BASE) MCG/ACT IN AERS: 2.0000 | INHALATION_SPRAY | RESPIRATORY_TRACT | 0 refills | Status: AC | PRN

## 2024-10-08 MED ORDER — PREDNISONE 10 MG PO TABS: 20.0000 mg | ORAL_TABLET | Freq: Every day | ORAL | 0 refills | Status: DC

## 2024-10-08 MED ORDER — PROMETHAZINE-DM 6.25-15 MG/5ML PO SYRP: 5.0000 mL | ORAL_SOLUTION | Freq: Four times a day (QID) | ORAL | 0 refills | Status: AC | PRN

## 2024-10-08 NOTE — ED Triage Notes (Signed)
 Pt reports cough fever and body aches, since thanksgiving fever had left for a few days and has come back did E-visit on Saturday was given doxycycline. Has found minimal relief.

## 2024-10-08 NOTE — Discharge Instructions (Signed)
 Continue the doxycycline, and take the prescribed medications as well as mucinex, astelin and or flonase nasal spray, humidifiers, otc fever reducers as needed

## 2024-10-09 DIAGNOSIS — R509 Fever, unspecified: Secondary | ICD-10-CM

## 2024-10-09 DIAGNOSIS — J4521 Mild intermittent asthma with (acute) exacerbation: Secondary | ICD-10-CM

## 2024-10-09 DIAGNOSIS — J069 Acute upper respiratory infection, unspecified: Secondary | ICD-10-CM

## 2024-10-09 NOTE — ED Notes (Addendum)
 Pt physically discharged on 10/08/24 by other UC staff. Pt electronically discharged from epic on 10/09/24.

## 2024-10-09 NOTE — ED Provider Notes (Signed)
 [facility]    CSN: [id number] Arrival date & time: 10/08/24  1920      History   Chief Complaint No chief complaint on file.   HPI Mitchell Cochran is a 28 y.o. male.   Patient presenting today with 2-week history of waxing and waning cough, fever, body aches, congestion.  States he did an e-visit 2 days ago and was given doxycycline for cough but notes no significant relief since starting.  Not taking anything over-the-counter for symptoms currently but did take over-the-counter cold and congestion medications earlier in course.  Denies chest pain, shortness of breath, abdominal pain, vomiting, diarrhea.  No known chronic pulmonary disease although did have asthma as a child.  Notes he has not had an inhaler for many years.    Past Medical History:  Diagnosis Date   GERD (gastroesophageal reflux disease)    Sleep apnea     Patient Active Problem List   Diagnosis Date Noted   Eosinophilic esophagitis 04/30/2024   Gastritis and gastroduodenitis 03/27/2024   BMI 31.0-31.9,adult 02/29/2024   Encounter for general adult medical examination with abnormal findings 03/16/2023   Sleep disturbance 03/16/2023   Right hip pain 09/21/2022   GERD (gastroesophageal reflux disease) 04/06/2022   Sleep apnea in adult 04/06/2022    Past Surgical History:  Procedure Laterality Date   BRAVO PH STUDY N/A 03/27/2024   Procedure: PH MONITORING, ESOPHAGUS, WIRELESS;  Surgeon: Cinderella Deatrice FALCON, MD;  Location: [facility];  Service: Endoscopy;  Laterality: N/A;  10:45AM;ASA 1-2   ESOPHAGOGASTRODUODENOSCOPY N/A 03/27/2024   Procedure: EGD (ESOPHAGOGASTRODUODENOSCOPY);  Surgeon: Cinderella Deatrice FALCON, MD;  Location: [facility];  Service: Endoscopy;  Laterality: N/A;  10:45AM;ASA 1-2 - Bravo       Home Medications    Prior to Admission medications   Medication Sig Start Date End Date Taking? Authorizing Provider  albuterol  (VENTOLIN  HFA) 108 (90 Base) MCG/ACT inhaler Inhale 2 puffs  into the lungs every 4 (four) hours as needed. 10/08/24  Yes Stuart Vernell Norris, PA-C  doxycycline (VIBRA-TABS) 100 MG tablet Take 100 mg by mouth 2 (two) times daily. 10/07/24  Yes [provider]  predniSONE  (DELTASONE ) 10 MG tablet Take 2 tablets (20 mg total) by mouth daily. 10/08/24  Yes Stuart Vernell Norris, PA-C  promethazine -dextromethorphan (PROMETHAZINE -DM) 6.25-15 MG/5ML syrup Take 5 mLs by mouth 4 (four) times daily as needed. 10/08/24  Yes Stuart Vernell Norris, PA-C  EPINEPHrine  0.3 mg/0.3 mL IJ SOAJ injection Inject 0.3 mg into the muscle as needed for anaphylaxis. 05/16/24   Suellen Cantor A, PA-C  omeprazole  (PRILOSEC) 40 MG capsule Take 1 capsule (40 mg total) by mouth in the morning and at bedtime. 04/30/24 06/29/24  Cinderella Deatrice FALCON, MD    Family History Family History  Problem Relation Age of Onset   Diabetes Mother    Diabetes Father     Social History Social History   Tobacco Use   Smoking status: Never    Passive exposure: Never   Smokeless tobacco: Current    Types: Snuff  Vaping Use   Vaping status: Never Used  Substance Use Topics   Alcohol use: No   Drug use: No     Allergies   Bee venom   Review of Systems Review of Systems PER HPI  Physical Exam Triage Vital Signs ED Triage Vitals  Encounter Vitals Group     BP 10/08/24 1941 (!) 149/83     Girls Systolic BP Percentile --  Girls Diastolic BP Percentile --      Boys Systolic BP Percentile --      Boys Diastolic BP Percentile --      Pulse Rate 10/08/24 1941 (!) 117     Resp 10/08/24 1941 20     Temp 10/08/24 1941 99.4 F (37.4 C)     Temp Source 10/08/24 1941 Oral     SpO2 10/08/24 1941 95 %     Weight --      Height --      Head Circumference --      Peak Flow --      Pain Score 10/08/24 1942 0     Pain Loc --      Pain Education --      Exclude from Growth Chart --    No data found.  Updated Vital Signs BP (!) 149/83 (BP Location: Right Arm)   Pulse (!)  117   Temp 99.4 F (37.4 C) (Oral)   Resp 20   SpO2 95%   Visual Acuity Right Eye Distance:   Left Eye Distance:   Bilateral Distance:    Right Eye Near:   Left Eye Near:    Bilateral Near:     Physical Exam Vitals and nursing note reviewed.  Constitutional:      Appearance: Normal appearance. He is well-developed.  HENT:     Head: Atraumatic.     Right Ear: External ear normal.     Left Ear: External ear normal.     Nose: Rhinorrhea present.     Mouth/Throat:     Pharynx: Posterior oropharyngeal erythema present. No oropharyngeal exudate.  Eyes:     Extraocular Movements: Extraocular movements intact.     Conjunctiva/sclera: Conjunctivae normal.     Pupils: Pupils are equal, round, and reactive to light.  Cardiovascular:     Rate and Rhythm: Normal rate and regular rhythm.  Pulmonary:     Effort: Pulmonary effort is normal. No respiratory distress.     Breath sounds: Normal breath sounds. No wheezing or rales.  Musculoskeletal:        General: Normal range of motion.     Cervical back: Normal range of motion and neck supple.  Lymphadenopathy:     Cervical: No cervical adenopathy.  Skin:    General: Skin is warm and dry.  Neurological:     General: No focal deficit present.     Mental Status: He is alert and oriented to person, place, and time.  Psychiatric:        Mood and Affect: Mood normal.        Behavior: Behavior normal.        Thought Content: Thought content normal.        Judgment: Judgment normal.      UC Treatments / Results  Labs (all labs ordered are listed, but only abnormal results are displayed) Labs Reviewed - No data to display  EKG   Radiology No results found.  Procedures Procedures (including critical care time)  Medications Ordered in UC Medications - No data to display  Initial Impression / Assessment and Plan / UC Course  I have reviewed the triage vital signs and the nursing notes.  Pertinent labs & imaging results  that were available during my care of the patient were reviewed by me and considered in my medical decision making (see chart for details).     Suspect viral respiratory infection causing asthma exacerbation.  May complete the doxycycline if  desired but not providing any benefit and no evidence of pneumonia today.  Will treat with prednisone , albuterol , Phenergan  DM, supportive over-the-counter medications and home care.  Return for worsening or unresolving symptoms.  Final Clinical Impressions(s) / UC Diagnoses   Final diagnoses:  Viral URI with cough  Fever, unspecified  Mild intermittent asthma with acute exacerbation     Discharge Instructions      Continue the doxycycline, and take the prescribed medications as well as mucinex, astelin and or flonase nasal spray, humidifiers, otc fever reducers as needed    ED Prescriptions     Medication Sig Dispense Auth. Provider   predniSONE  (DELTASONE ) 10 MG tablet Take 2 tablets (20 mg total) by mouth daily. 15 tablet Stuart Vernell Norris, PA-C   albuterol  (VENTOLIN  HFA) 108 (90 Base) MCG/ACT inhaler Inhale 2 puffs into the lungs every 4 (four) hours as needed. 18 g Stuart Vernell Norris, PA-C   promethazine -dextromethorphan (PROMETHAZINE -DM) 6.25-15 MG/5ML syrup Take 5 mLs by mouth 4 (four) times daily as needed. 100 mL Stuart Vernell Norris, [place]      PDMP not reviewed this encounter.   Stuart Vernell Norris, PA-C 10/09/24 1651

## 2024-10-10 ENCOUNTER — Other Ambulatory Visit: Payer: Self-pay | Admitting: Family Medicine

## 2024-10-10 DIAGNOSIS — R051 Acute cough: Secondary | ICD-10-CM

## 2024-10-10 NOTE — Telephone Encounter (Signed)
 Please inform the patient to continue his treatment regimen as prescribed. Let him know that if he does have pneumonia, doxycycline is an appropriate antibiotic choice. Orders have been placed for a chest X-ray to assess for pneumonia.

## 2024-10-11 ENCOUNTER — Ambulatory Visit (HOSPITAL_COMMUNITY)
Admission: RE | Admit: 2024-10-11 | Discharge: 2024-10-11 | Disposition: A | Discharge status: Home / Self Care | Source: Ambulatory Visit | Attending: Family Medicine | Admitting: Family Medicine

## 2024-10-11 DIAGNOSIS — R051 Acute cough: Secondary | ICD-10-CM | POA: Insufficient documentation

## 2024-10-12 ENCOUNTER — Ambulatory Visit

## 2024-10-12 ENCOUNTER — Other Ambulatory Visit: Payer: Self-pay

## 2024-10-12 DIAGNOSIS — R509 Fever, unspecified: Secondary | ICD-10-CM | POA: Diagnosis not present

## 2024-10-12 DIAGNOSIS — R051 Acute cough: Secondary | ICD-10-CM | POA: Diagnosis not present

## 2024-10-12 MED ORDER — BENZONATATE 200 MG PO CAPS: 200.0000 mg | ORAL_CAPSULE | Freq: Two times a day (BID) | ORAL | 0 refills | Status: DC | PRN

## 2024-10-12 MED ORDER — AZITHROMYCIN 250 MG PO TABS: ORAL_TABLET | ORAL | 0 refills | Status: DC

## 2024-10-12 NOTE — Progress Notes (Unsigned)
 Established Patient Office Visit  Subjective   Patient ID: Mitchell Cochran, male    DOB: 11/14/1995  Age: 28 y.o. MRN: [id number]  Chief Complaint  Patient presents with   Medical Management of Chronic Issues    Pt here due to fever pt has had for +6 days fever really spikes a night time     HPI Discussed the use of AI scribe software for clinical note transcription with the patient, who gave verbal consent to proceed.  History of Present Illness    Mitchell Cochran is a 28 year old male who presents with persistent high fever and chest congestion.  Fever and constitutional symptoms - High fever up to 103.65F for six days, with nightly spikes - Severe shaking and chills, intense enough to require pulling over while driving - Takes ibuprofen and Tylenol  around the clock, last dose at 1 AM today  Respiratory symptoms - Chest congestion is present - No sore throat  Gastrointestinal symptoms - No gastrointestinal bleeding - Wife, a nurse, expressed concern about potential GI issues from high doses of ibuprofen and Tylenol   Exposure history - No illness in household contacts, including wife and three children - Colleagues at workplace became ill after his visit last week     Patient Active Problem List   Diagnosis Date Noted   Fever 10/14/2024   Eosinophilic esophagitis 04/30/2024   Gastritis and gastroduodenitis 03/27/2024   BMI 31.0-31.9,adult 02/29/2024   Encounter for general adult medical examination with abnormal findings 03/16/2023   Sleep disturbance 03/16/2023   Right hip pain 09/21/2022   GERD (gastroesophageal reflux disease) 04/06/2022   Sleep apnea in adult 04/06/2022    ROS    Objective:     BP 113/79   Pulse (!) 120   Temp 99.2 F (37.3 C) (Oral)   Ht 6' (1.829 m)   Wt 232 lb 1.9 oz (105.3 kg)   SpO2 97%   BMI 31.48 kg/m  BP Readings from Last 3 Encounters:  10/14/24 (!) 141/89  10/12/24 113/79  10/08/24 (!) 149/83   Wt Readings from Last 3  Encounters:  10/14/24 232 lb 12.9 oz (105.6 kg)  10/12/24 232 lb 1.9 oz (105.3 kg)  05/16/24 240 lb (108.9 kg)      Physical Exam Vitals and nursing note reviewed.  Constitutional:      Appearance: Normal appearance.  HENT:     Head: Normocephalic.     Right Ear: Tympanic membrane, ear canal and external ear normal.     Left Ear: Tympanic membrane, ear canal and external ear normal.     Nose: Congestion and rhinorrhea present.     Right Turbinates: Enlarged.     Left Turbinates: Enlarged.     Right Sinus: No maxillary sinus tenderness or frontal sinus tenderness.     Left Sinus: No maxillary sinus tenderness or frontal sinus tenderness.     Mouth/Throat:     Mouth: Mucous membranes are moist.     Pharynx: Oropharynx is clear.  Eyes:     Extraocular Movements: Extraocular movements intact.     Pupils: Pupils are equal, round, and reactive to light.  Pulmonary:     Effort: Pulmonary effort is normal.     Breath sounds: Normal breath sounds.  Skin:    General: Skin is warm and dry.  Neurological:     Mental Status: He is alert and oriented to person, place, and time.  Psychiatric:        Mood  and Affect: Mood normal.        Thought Content: Thought content normal.    No results found for any visits on 10/12/24.    The ASCVD Risk score (Arnett DK, et al., 2019) failed to calculate for the following reasons:   The 2019 ASCVD risk score is only valid for ages 32 to 60    Assessment & Plan:   Problem List Items Addressed This Visit       Other   Fever   Relevant Orders   Veritor Flu A/B Waived   Other Visit Diagnoses       Acute cough    -  Primary   Fever for six days, temperature up to 103.37F. Differential includes pneumonia. Influenza test in office was negative.  add oral antibiotics given duration.   Relevant Medications   azithromycin  (ZITHROMAX ) 250 MG tablet   benzonatate  (TESSALON ) 200 MG capsule       No follow-ups on file.    Leita Longs,  [name]

## 2024-10-13 ENCOUNTER — Emergency Department (HOSPITAL_COMMUNITY)

## 2024-10-13 ENCOUNTER — Encounter (HOSPITAL_COMMUNITY): Payer: Self-pay | Admitting: *Deleted

## 2024-10-13 ENCOUNTER — Other Ambulatory Visit: Payer: Self-pay

## 2024-10-13 ENCOUNTER — Inpatient Hospital Stay (HOSPITAL_COMMUNITY)
Admission: EM | Admit: 2024-10-13 | Discharge: 2024-10-17 | DRG: 813 | Disposition: A | Attending: Hospitalist | Admitting: Hospitalist

## 2024-10-13 DIAGNOSIS — Z91030 Bee allergy status: Secondary | ICD-10-CM

## 2024-10-13 DIAGNOSIS — E66811 Obesity, class 1: Secondary | ICD-10-CM | POA: Diagnosis present

## 2024-10-13 DIAGNOSIS — D6959 Other secondary thrombocytopenia: Principal | ICD-10-CM | POA: Diagnosis present

## 2024-10-13 DIAGNOSIS — Z6831 Body mass index (BMI) 31.0-31.9, adult: Secondary | ICD-10-CM

## 2024-10-13 DIAGNOSIS — D61818 Other pancytopenia: Secondary | ICD-10-CM | POA: Diagnosis present

## 2024-10-13 DIAGNOSIS — B259 Cytomegaloviral disease, unspecified: Secondary | ICD-10-CM | POA: Diagnosis present

## 2024-10-13 DIAGNOSIS — Z87891 Personal history of nicotine dependence: Secondary | ICD-10-CM

## 2024-10-13 DIAGNOSIS — Z833 Family history of diabetes mellitus: Secondary | ICD-10-CM

## 2024-10-13 DIAGNOSIS — R911 Solitary pulmonary nodule: Secondary | ICD-10-CM | POA: Diagnosis present

## 2024-10-13 DIAGNOSIS — G4730 Sleep apnea, unspecified: Secondary | ICD-10-CM | POA: Diagnosis present

## 2024-10-13 DIAGNOSIS — T3695XA Adverse effect of unspecified systemic antibiotic, initial encounter: Secondary | ICD-10-CM | POA: Diagnosis present

## 2024-10-13 LAB — CBC WITH DIFFERENTIAL/PLATELET
Abs Immature Granulocytes: 0.01 K/uL (ref 0.00–0.07)
Basophils Absolute: 0 K/uL (ref 0.0–0.1)
Basophils Relative: 1 %
Eosinophils Absolute: 0.1 K/uL (ref 0.0–0.5)
Eosinophils Relative: 1 %
HCT: 39.2 % (ref 39.0–52.0)
Hemoglobin: 13.5 g/dL (ref 13.0–17.0)
Immature Granulocytes: 0 %
Lymphocytes Relative: 76 %
Lymphs Abs: 3.6 K/uL (ref 0.7–4.0)
MCH: 30.5 pg (ref 26.0–34.0)
MCHC: 34.4 g/dL (ref 30.0–36.0)
MCV: 88.5 fL (ref 80.0–100.0)
Monocytes Absolute: 0.2 K/uL (ref 0.1–1.0)
Monocytes Relative: 4 %
Neutro Abs: 0.8 K/uL — ABNORMAL LOW (ref 1.7–7.7)
Neutrophils Relative %: 18 %
Platelets: 77 K/uL — ABNORMAL LOW (ref 150–400)
RBC: 4.43 MIL/uL (ref 4.22–5.81)
RDW: 12.7 % (ref 11.5–15.5)
WBC: 4.6 K/uL (ref 4.0–10.5)
nRBC: 0 % (ref 0.0–0.2)

## 2024-10-13 LAB — COMPREHENSIVE METABOLIC PANEL WITH GFR
ALT: 76 U/L — ABNORMAL HIGH (ref 0–44)
AST: 67 U/L — ABNORMAL HIGH (ref 15–41)
Albumin: 3.8 g/dL (ref 3.5–5.0)
Alkaline Phosphatase: 109 U/L (ref 38–126)
Anion gap: 8 (ref 5–15)
BUN: 15 mg/dL (ref 6–20)
CO2: 26 mmol/L (ref 22–32)
Calcium: 8.8 mg/dL — ABNORMAL LOW (ref 8.9–10.3)
Chloride: 104 mmol/L (ref 98–111)
Creatinine, Ser: 0.94 mg/dL (ref 0.61–1.24)
GFR, Estimated: >60 mL/min (ref >60)
Glucose, Bld: 122 mg/dL — ABNORMAL HIGH (ref 70–99)
Potassium: 3.5 mmol/L (ref 3.5–5.1)
Sodium: 139 mmol/L (ref 135–145)
Total Bilirubin: 0.9 mg/dL (ref 0.0–1.2)
Total Protein: 6.7 g/dL (ref 6.5–8.1)

## 2024-10-13 LAB — LACTIC ACID, PLASMA: Lactic Acid, Venous: 0.9 mmol/L (ref 0.5–1.9)

## 2024-10-13 MED ORDER — LACTATED RINGERS IV SOLN: INTRAVENOUS | Status: AC

## 2024-10-13 MED ORDER — SODIUM CHLORIDE 0.9 % IV SOLN
1.0000 g | Freq: Once | INTRAVENOUS | Status: AC
  Administered 2024-10-13: 1 g via INTRAVENOUS
  Filled 2024-10-13: qty 10

## 2024-10-13 MED ORDER — SODIUM CHLORIDE 0.9 % IV BOLUS
1000.0000 mL | Freq: Once | INTRAVENOUS | Status: AC
  Administered 2024-10-13: 1000 mL via INTRAVENOUS

## 2024-10-13 MED ORDER — SODIUM CHLORIDE 0.9 % IV SOLN: Freq: Once | INTRAVENOUS | Status: AC

## 2024-10-13 NOTE — Progress Notes (Signed)
 Pt being followed by [facility] for Sepsis protocol.

## 2024-10-13 NOTE — ED Triage Notes (Signed)
 POV from home.  CC of fever/ SOB since 11/26. Lasted 3-4 days came back 2 days later. Unable to get fever of 101-102 to go down since.  No one else in home sick. Sunday put on doxycycline at [facility].  Went to pcp and got started on z-pack 2 days ago.  Still not feeling any better. Diaphoretic upon arrival.  Took 800mg  ibuprofen at 9pm.

## 2024-10-13 NOTE — ED Provider Notes (Signed)
 [place]  EMERGENCY DEPARTMENT AT [facility] Provider Note   CSN: [id number] Arrival date & time: 10/13/24  2142     Patient presents with: Fever and Shortness of Breath   Mitchell Cochran is a 28 y.o. male.  Presents today with a fever that started 2 weeks ago.  Highest fever was 103F.  He has been taking Tylenol  and ibuprofen as needed for his fever. He was seen 2 days ago by primary care where chest x-ray was obtained and he was given a Z-Pak.  He states that he has been feeling the same.  Also reports a headache but is mild. denies neck pain.  Patient also has shortness of breath that started 2 weeks ago. no drug use.  Denies cough congestion nausea vomiting diarrhea denies numbness tingling leg swelling rashes back pain. No recent travel. No exposure to wild animals.       Fever Shortness of Breath Associated symptoms: fever        Prior to Admission medications  Medication Sig Start Date End Date Taking? Authorizing Provider  albuterol  (VENTOLIN  HFA) 108 (90 Base) MCG/ACT inhaler Inhale 2 puffs into the lungs every 4 (four) hours as needed. 10/08/24   Stuart Vernell Norris, PA-C  azithromycin  (ZITHROMAX ) 250 MG tablet Take 2 tablets on day 1, then 1 tablet daily on days 2 through 5 10/12/24 10/17/24  Bevely Doffing, [name]  benzonatate  (TESSALON ) 200 MG capsule Take 1 capsule (200 mg total) by mouth 2 (two) times daily as needed for cough. 10/12/24   Bevely Doffing, [name]  doxycycline (VIBRA-TABS) 100 MG tablet Take 100 mg by mouth 2 (two) times daily. 10/07/24   [provider]  EPINEPHrine  0.3 mg/0.3 mL IJ SOAJ injection Inject 0.3 mg into the muscle as needed for anaphylaxis. 05/16/24   Suellen Cantor A, PA-C  omeprazole  (PRILOSEC) 40 MG capsule Take 1 capsule (40 mg total) by mouth in the morning and at bedtime. 04/30/24 10/12/24  Ahmed, Muhammad F, MD  predniSONE  (DELTASONE ) 10 MG tablet Take 2 tablets (20 mg total) by mouth daily. 10/08/24   Stuart Vernell Norris, PA-C  promethazine -dextromethorphan (PROMETHAZINE -DM) 6.25-15 MG/5ML syrup Take 5 mLs by mouth 4 (four) times daily as needed. 10/08/24   Stuart Vernell Norris, PA-C    Allergies: Bee venom    Review of Systems  Constitutional:  Positive for fever.  Respiratory:  Positive for shortness of breath.     Updated Vital Signs BP 120/84   Pulse (!) 117   Temp 99.2 F (37.3 C) (Oral)   Resp (!) 23   Ht 6' (1.829 m)   Wt 104.3 kg   SpO2 95%   BMI 31.19 kg/m   Physical Exam Vitals and nursing note reviewed.  Constitutional:      General: He is not in acute distress.    Appearance: He is well-developed.     Comments: Diaphoretic  HENT:     Head: Normocephalic and atraumatic.  Eyes:     Conjunctiva/sclera: Conjunctivae normal.  Cardiovascular:     Rate and Rhythm: Regular rhythm. Tachycardia present.     Heart sounds: No murmur heard. Pulmonary:     Effort: Pulmonary effort is normal. No respiratory distress.     Breath sounds: Normal breath sounds.  Abdominal:     Palpations: Abdomen is soft.     Tenderness: There is no abdominal tenderness.  Musculoskeletal:        General: No swelling.     Cervical back: Neck supple.  Skin:    General: Skin is warm and dry.     Capillary Refill: Capillary refill takes less than 2 seconds.  Neurological:     Mental Status: He is alert.  Psychiatric:        Mood and Affect: Mood normal.     (all labs ordered are listed, but only abnormal results are displayed) Labs Reviewed  COMPREHENSIVE METABOLIC PANEL WITH GFR - Abnormal; Notable for the following components:      Result Value   Glucose, Bld 122 (*)    Calcium 8.8 (*)    AST 67 (*)    ALT 76 (*)    All other components within normal limits  CBC WITH DIFFERENTIAL/PLATELET - Abnormal; Notable for the following components:   Platelets 77 (*)    Neutro Abs 0.8 (*)    All other components within normal limits  CULTURE, BLOOD (ROUTINE X 2)  CULTURE, BLOOD (ROUTINE X  2)  LACTIC ACID, PLASMA  LACTIC ACID, PLASMA  URINALYSIS, W/ REFLEX TO CULTURE (INFECTION SUSPECTED)    EKG: EKG Interpretation Date/Time:  Saturday October 13 2024 21:57:46 EST Ventricular Rate:  125 PR Interval:  170 QRS Duration:  96 QT Interval:  299 QTC Calculation: 432 R Axis:   50  Text Interpretation: Sinus tachycardia since last tracing no significant change Confirmed by Cleotilde Rogue ([id number]) on 10/13/2024 10:02:06 PM  Radiology: [facility] Chest Port 1 View Result Date: 10/13/2024 EXAM: 1 VIEW(S) XRAY OF THE CHEST 10/13/2024 11:01:35 PM COMPARISON: 10/11/2024 CLINICAL HISTORY: Fever and shortness of breath for several days. FINDINGS: LUNGS AND PLEURA: No focal pulmonary opacity. No pleural effusion. No pneumothorax. HEART AND MEDIASTINUM: No acute abnormality of the cardiac and mediastinal silhouettes. BONES AND SOFT TISSUES: No acute osseous abnormality. IMPRESSION: 1. No acute cardiopulmonary process. Electronically signed by: Oneil Devonshire MD 10/13/2024 11:07 PM EST RP Workstation: [id number]     Procedures   Medications Ordered in the ED  lactated ringers  infusion (0 mLs Intravenous Paused 10/13/24 2248)  cefTRIAXone  (ROCEPHIN ) 1 g in sodium chloride  0.9 % 100 mL IVPB (1 g Intravenous New Bag/Given 10/13/24 2251)  0.9 %  sodium chloride  infusion (0 mLs Intravenous Stopped 10/13/24 2247)  sodium chloride  0.9 % bolus 1,000 mL (1,000 mLs Intravenous Bolus from Bag 10/13/24 2247)    Clinical Course as of 10/14/24 0003  Sat Oct 13, 2024  2221 Rocehin 1g  [AL]    Clinical Course User Index [AL] Braxton Dubois, PA-C                                 Medical Decision Making     This patient presents to the ED for concern of fever of 103F started 2 weeks ago, this involves an extensive number of treatment options, and is a complaint that carries with it a high risk of complications and morbidity.  He was treated for 2 different antibiotics with no relief. the differential  diagnosis includes leukemia, lymphoma, endocarditis, malaria, pneumonia, meningitis, influenza      Additional history obtained:  Additional history obtained from EMR External records from outside source obtained and reviewed including chest x-ray from 10/11/2024 which was unremarkable.  Also reviewed office from 10/08/2024 yesterday where he was prescribed antibiotics.    Lab Tests:  I Ordered, and personally interpreted labs.  The pertinent results include:  elevated AST and ALT, low platelets    Imaging Studies ordered:  I ordered imaging  studies including chest x-ray I independently visualized and interpreted imaging which showed no pneumonia or other abnormality.  I agree with the radiologist interpretation   Cardiac Monitoring: / EKG:  The patient was maintained on a cardiac monitor.  I personally viewed and interpreted the cardiac monitored which showed an underlying rhythm of: Sinus tachycardic   Problem List / ED Course / Critical interventions / Medication management  Ordered fluids and Rocephin     Patient is admitted to hospitalist for further workup of fever.          Final diagnoses:  None    ED Discharge Orders     None          Braxton Dubois, PA-C 10/14/24 0003    Cleotilde Rogue, MD 10/14/24 2226

## 2024-10-13 NOTE — ED Notes (Signed)
 Lab called to add orders to blood previously sent

## 2024-10-14 ENCOUNTER — Inpatient Hospital Stay (HOSPITAL_COMMUNITY)

## 2024-10-14 DIAGNOSIS — B259 Cytomegaloviral disease, unspecified: Secondary | ICD-10-CM | POA: Diagnosis present

## 2024-10-14 DIAGNOSIS — D696 Thrombocytopenia, unspecified: Secondary | ICD-10-CM | POA: Diagnosis not present

## 2024-10-14 DIAGNOSIS — D61818 Other pancytopenia: Secondary | ICD-10-CM | POA: Diagnosis present

## 2024-10-14 DIAGNOSIS — E66811 Obesity, class 1: Secondary | ICD-10-CM | POA: Diagnosis not present

## 2024-10-14 DIAGNOSIS — R911 Solitary pulmonary nodule: Secondary | ICD-10-CM | POA: Diagnosis present

## 2024-10-14 DIAGNOSIS — R509 Fever, unspecified: Secondary | ICD-10-CM | POA: Diagnosis not present

## 2024-10-14 DIAGNOSIS — G4730 Sleep apnea, unspecified: Secondary | ICD-10-CM | POA: Diagnosis present

## 2024-10-14 DIAGNOSIS — R7401 Elevation of levels of liver transaminase levels: Secondary | ICD-10-CM | POA: Diagnosis not present

## 2024-10-14 DIAGNOSIS — R6521 Severe sepsis with septic shock: Secondary | ICD-10-CM | POA: Diagnosis not present

## 2024-10-14 DIAGNOSIS — Z87891 Personal history of nicotine dependence: Secondary | ICD-10-CM | POA: Diagnosis not present

## 2024-10-14 DIAGNOSIS — R0602 Shortness of breath: Secondary | ICD-10-CM | POA: Diagnosis present

## 2024-10-14 DIAGNOSIS — Z91030 Bee allergy status: Secondary | ICD-10-CM | POA: Diagnosis not present

## 2024-10-14 DIAGNOSIS — Z6831 Body mass index (BMI) 31.0-31.9, adult: Secondary | ICD-10-CM | POA: Diagnosis not present

## 2024-10-14 DIAGNOSIS — Z833 Family history of diabetes mellitus: Secondary | ICD-10-CM | POA: Diagnosis not present

## 2024-10-14 DIAGNOSIS — T3695XA Adverse effect of unspecified systemic antibiotic, initial encounter: Secondary | ICD-10-CM | POA: Diagnosis present

## 2024-10-14 DIAGNOSIS — D6959 Other secondary thrombocytopenia: Secondary | ICD-10-CM | POA: Diagnosis present

## 2024-10-14 DIAGNOSIS — D72819 Decreased white blood cell count, unspecified: Secondary | ICD-10-CM | POA: Diagnosis not present

## 2024-10-14 LAB — CBC
HCT: 37.1 % — ABNORMAL LOW (ref 39.0–52.0)
Hemoglobin: 12.6 g/dL — ABNORMAL LOW (ref 13.0–17.0)
MCH: 30.6 pg (ref 26.0–34.0)
MCHC: 34 g/dL (ref 30.0–36.0)
MCV: 90 fL (ref 80.0–100.0)
Platelets: 73 K/uL — ABNORMAL LOW (ref 150–400)
RBC: 4.12 MIL/uL — ABNORMAL LOW (ref 4.22–5.81)
RDW: 12.9 % (ref 11.5–15.5)
WBC: 4.6 K/uL (ref 4.0–10.5)
nRBC: 0 % (ref 0.0–0.2)

## 2024-10-14 LAB — URINALYSIS, W/ REFLEX TO CULTURE (INFECTION SUSPECTED)
Bacteria, UA: NONE SEEN
Bilirubin Urine: NEGATIVE
Glucose, UA: NEGATIVE mg/dL
Hgb urine dipstick: NEGATIVE
Ketones, ur: 20 mg/dL — AB
Leukocytes,Ua: NEGATIVE
Nitrite: NEGATIVE
Protein, ur: 100 mg/dL — AB
Specific Gravity, Urine: 1.026 (ref 1.005–1.030)
pH: 5 (ref 5.0–8.0)

## 2024-10-14 LAB — URINE DRUG SCREEN
Amphetamines: NEGATIVE
Barbiturates: NEGATIVE
Benzodiazepines: NEGATIVE
Cocaine: NEGATIVE
Fentanyl: NEGATIVE
Methadone Scn, Ur: NEGATIVE
Opiates: NEGATIVE
Tetrahydrocannabinol: NEGATIVE

## 2024-10-14 LAB — COMPREHENSIVE METABOLIC PANEL WITH GFR
ALT: 72 U/L — ABNORMAL HIGH (ref 0–44)
AST: 64 U/L — ABNORMAL HIGH (ref 15–41)
Albumin: 3.7 g/dL (ref 3.5–5.0)
Alkaline Phosphatase: 98 U/L (ref 38–126)
Anion gap: 10 (ref 5–15)
BUN: 13 mg/dL (ref 6–20)
CO2: 24 mmol/L (ref 22–32)
Calcium: 8.6 mg/dL — ABNORMAL LOW (ref 8.9–10.3)
Chloride: 105 mmol/L (ref 98–111)
Creatinine, Ser: 0.85 mg/dL (ref 0.61–1.24)
GFR, Estimated: >60 mL/min (ref >60)
Glucose, Bld: 87 mg/dL (ref 70–99)
Potassium: 3.8 mmol/L (ref 3.5–5.1)
Sodium: 139 mmol/L (ref 135–145)
Total Bilirubin: 0.8 mg/dL (ref 0.0–1.2)
Total Protein: 6.5 g/dL (ref 6.5–8.1)

## 2024-10-14 LAB — RESP PANEL BY RT-PCR (RSV, FLU A&B, COVID)  RVPGX2
Influenza A by PCR: NEGATIVE
Influenza B by PCR: NEGATIVE
Resp Syncytial Virus by PCR: NEGATIVE
SARS Coronavirus 2 by RT PCR: NEGATIVE

## 2024-10-14 LAB — PROCALCITONIN: Procalcitonin: 0.63 ng/mL

## 2024-10-14 LAB — MAGNESIUM: Magnesium: 2.1 mg/dL (ref 1.7–2.4)

## 2024-10-14 LAB — PHOSPHORUS: Phosphorus: 2.6 mg/dL (ref 2.5–4.6)

## 2024-10-14 MED ORDER — VANCOMYCIN HCL 2000 MG/400ML IV SOLN
2000.0000 mg | Freq: Two times a day (BID) | INTRAVENOUS | Status: DC
  Administered 2024-10-14 – 2024-10-15 (×2): 2000 mg via INTRAVENOUS
  Filled 2024-10-14 (×4): qty 400

## 2024-10-14 MED ORDER — ONDANSETRON HCL 4 MG PO TABS: 4.0000 mg | ORAL_TABLET | Freq: Four times a day (QID) | ORAL | Status: DC | PRN

## 2024-10-14 MED ORDER — ACETAMINOPHEN 650 MG RE SUPP: 650.0000 mg | Freq: Four times a day (QID) | RECTAL | Status: DC | PRN

## 2024-10-14 MED ORDER — KETOROLAC TROMETHAMINE 15 MG/ML IJ SOLN
15.0000 mg | Freq: Four times a day (QID) | INTRAMUSCULAR | Status: AC | PRN
  Administered 2024-10-14 – 2024-10-15 (×2): 15 mg via INTRAVENOUS
  Filled 2024-10-14 (×2): qty 1

## 2024-10-14 MED ORDER — ORAL CARE MOUTH RINSE: 15.0000 mL | OROMUCOSAL | Status: DC | PRN

## 2024-10-14 MED ORDER — VANCOMYCIN HCL 2000 MG/400ML IV SOLN
2000.0000 mg | Freq: Once | INTRAVENOUS | Status: AC
  Administered 2024-10-14: 2000 mg via INTRAVENOUS
  Filled 2024-10-14: qty 400

## 2024-10-14 MED ORDER — ONDANSETRON HCL 4 MG/2ML IJ SOLN: 4.0000 mg | Freq: Four times a day (QID) | INTRAMUSCULAR | Status: DC | PRN

## 2024-10-14 MED ORDER — ACETAMINOPHEN 325 MG PO TABS
650.0000 mg | ORAL_TABLET | Freq: Four times a day (QID) | ORAL | Status: DC | PRN
  Administered 2024-10-14 – 2024-10-15 (×4): 650 mg via ORAL
  Filled 2024-10-14 (×4): qty 2

## 2024-10-14 MED ORDER — PIPERACILLIN-TAZOBACTAM 3.375 G IVPB
3.3750 g | Freq: Three times a day (TID) | INTRAVENOUS | Status: DC
  Administered 2024-10-14 – 2024-10-15 (×5): 3.375 g via INTRAVENOUS
  Filled 2024-10-14 (×5): qty 50

## 2024-10-14 MED ORDER — IOHEXOL 350 MG/ML SOLN
100.0000 mL | Freq: Once | INTRAVENOUS | Status: AC | PRN
  Administered 2024-10-14: 100 mL via INTRAVENOUS

## 2024-10-14 NOTE — Progress Notes (Signed)
°   10/14/24 0903  TOC Brief Assessment  Insurance and Status Reviewed  Patient has primary care physician Yes  Home environment has been reviewed From Home  Prior level of function: Independent  Prior/Current Home Services No current home services  Social Drivers of Health Review SDOH reviewed no interventions necessary  Readmission risk has been reviewed Yes  Transition of care needs no transition of care needs at this time   Inpatient Care Management (ICM) has reviewed patient and no other ICM needs have been identified at this time. We will continue to monitor patient advancement through interdisciplinary progression rounds. If new patient transition needs arise, please place a ICM consult.

## 2024-10-14 NOTE — Plan of Care (Signed)

## 2024-10-14 NOTE — Progress Notes (Signed)
 Pharmacy Antibiotic Note  Mitchell Cochran is a 28 y.o. male admitted on 10/13/2024 with febrile illness of unknown origin.  Pharmacy has been consulted for Vancomycin  dosing. WBC WNL. Renal function ok.   Plan: Vancomycin  2000 mg IV q12h >>>Estimated AUC: 488 Zosyn  3.375G IV q8h to be infused over 4 hours Trend WBC, temp, renal function  F/U infectious work-up Drug levels as indicated   Height: 6' (182.9 cm) Weight: 104.3 kg (230 lb) IBW/kg (Calculated) : 77.6  Temp (24hrs), Avg:99.2 F (37.3 C), Min:99.2 F (37.3 C), Max:99.2 F (37.3 C)  Recent Labs  Lab 10/13/24 2223  WBC 4.6  CREATININE 0.94  LATICACIDVEN 0.9    Estimated Creatinine Clearance: 143.5 mL/min (by C-G formula based on SCr of 0.94 mg/dL).    Allergies[1]  Lynwood Mckusick, PharmD, BCPS Clinical Pharmacist Phone: [phone]     [1]  Allergies Allergen Reactions   Bee Venom Anaphylaxis

## 2024-10-14 NOTE — ED Notes (Signed)
 Hospitalist in room.

## 2024-10-14 NOTE — Plan of Care (Signed)
   Problem: Education: Goal: Knowledge of General Education information will improve Description: Including pain rating scale, medication(s)/side effects and non-pharmacologic comfort measures Outcome: Progressing   Problem: Clinical Measurements: Goal: Ability to maintain clinical measurements within normal limits will improve Outcome: Progressing Goal: Diagnostic test results will improve Outcome: Progressing

## 2024-10-14 NOTE — Progress Notes (Signed)
 PROGRESS NOTE    Mitchell Cochran  FMW:[id number] DOB: 1995-12-24 DOA: 10/13/2024 PCP: [name], [name]   Brief Narrative:   Mitchell Cochran is a 28 y.o. male with medical history significant of GERD who presents emergency department with onset of fever on 11/26, this resolved after about 4 days, fever returned after about 2 days and it was usually in the evening and was associated with diaphoresis.  On Sunday (12/7) he went to an urgent care and was started on doxycycline, he went to his PCP on Thursday (12/11) and was started on Z-Pak but continued to have fever with maximum temperature being 103F.  He has been taking Tylenol  and ibuprofen as needed for the fever.  He denies any sick contacts at home or at work and denies any tick bite, recent travel or exposure to wild animals.  Patient also denies chest pain, shortness of breath, nausea, vomiting, diarrhea.   ED course In the emergency department, he was tachycardic and tachypneic on arrival, temperature 99.2 F.  Workup showed normal CBC except thrombocytopenia.  CBG 122, AST 6 7, ALT 76.   Chest x-ray showed no acute cardiopulmonary process Empiric IV ceftriaxone  was given.  IV hydration was provided  Assessment & Plan:   Principal Problem:   Fever   Fever, unspecified fever cause ?  Pneumonia Procalcitonin 0.6 Chest x-ray negative Respiratory panel virus negative Patient complains of shortness of breath and cough Will obtain CTA PE protocol, rule out pneumonia/PE or other acute findings  Currently on broad-spectrum antibiotics, will de-escalate pending cultures.  thrombocytopenia Platelets 72.  This may be reactive Continue to monitor platelet levels   Transaminitis AST 67, ALT 76 Patient denies abdominal pain episodes.  Reports of occasional BR blood per rectum, suspect hemorrhoids. 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 Inpatient status is judged to be reasonable and necessary in order to provide the required intensity of service to ensure the patient's safety. The patient's presenting symptoms, physical exam findings, and initial radiographic and laboratory data in the context of their chronic comorbidities is felt to place them at high risk for further clinical deterioration. Furthermore, it is not anticipated that the patient will be medically stable for discharge from the hospital within 2 midnights of admission.    * I certify that at the point of admission it is my clinical judgment that the patient will require inpatient hospital care spanning beyond 2 midnights from the point of admission due to high intensity of service, high risk for further deterioration and high frequency of surveillance required.*  Antimicrobials:    Subjective:  Patient seen and examined at the bedside.  He is not in any acute distress.  He feels warm.  No documented fever.  Mildly tachycardic.  Vital signs are stable.  Objective: Vitals:   10/14/24 0800 10/14/24 0900 10/14/24 0931 10/14/24 1050  BP: 124/74 122/72  139/86  Pulse: 91 99  (!) 102  Resp: 20 20  19   Temp:   98.5 F (36.9 C) 99.8 F (37.7 C)  TempSrc:   Oral Oral  SpO2: 97% 98%  100%  Weight:    105.6 kg  Height:    6' (1.829 m)    Intake/Output Summary (Last 24  hours) at 10/14/2024 1339 Last data filed at 10/14/2024 1051 Gross per 24 hour  Intake 640 ml  Output --  Net 640 ml   Filed Weights   10/13/24 2242 10/14/24 1050  Weight: 104.3 kg 105.6 kg    Examination:  General exam: Appears calm and comfortable  Respiratory system: Bilateral decreased breath sounds at bases Cardiovascular system: S1 & S2 heard, Rate controlled Gastrointestinal system: Abdomen is nondistended, soft and nontender. Normal  bowel sounds heard. Extremities: No cyanosis, clubbing, edema  Central nervous system: Alert and oriented. No focal neurological deficits. Moving extremities Skin: No rashes, lesions or ulcers Psychiatry: Judgement and insight appear normal. Mood & affect appropriate.     Data Reviewed: I have personally reviewed following labs and imaging studies  CBC: Recent Labs  Lab 10/13/24 2223 10/14/24 0553  WBC 4.6 4.6  NEUTROABS 0.8*  --   HGB 13.5 12.6*  HCT 39.2 37.1*  MCV 88.5 90.0  PLT 77* 73*   Basic Metabolic Panel: Recent Labs  Lab 10/13/24 2223 10/14/24 0553  NA 139 139  K 3.5 3.8  CL 104 105  CO2 26 24  GLUCOSE 122* 87  BUN 15 13  CREATININE 0.94 0.85  CALCIUM 8.8* 8.6*  MG  --  2.1  PHOS  --  2.6   GFR: Estimated Creatinine Clearance: 159.6 mL/min (by C-G formula based on SCr of 0.85 mg/dL). Liver Function Tests: Recent Labs  Lab 10/13/24 2223 10/14/24 0553  AST 67* 64*  ALT 76* 72*  ALKPHOS 109 98  BILITOT 0.9 0.8  PROT 6.7 6.5  ALBUMIN 3.8 3.7   No results for input(s): LIPASE, AMYLASE in the last 168 hours. No results for input(s): AMMONIA in the last 168 hours. Coagulation Profile: No results for input(s): INR, PROTIME in the last 168 hours. Cardiac Enzymes: No results for input(s): CKTOTAL, CKMB, CKMBINDEX, TROPONINI in the last 168 hours. BNP (last 3 results) No results for input(s): PROBNP in the last 8760 hours. HbA1C: No results for input(s): HGBA1C in the last 72 hours. CBG: No results for input(s): GLUCAP in the last 168 hours. Lipid Profile: No results for input(s): CHOL, HDL, LDLCALC, TRIG, CHOLHDL, LDLDIRECT in the last 72 hours. Thyroid Function Tests: No results for input(s): TSH, T4TOTAL, FREET4, T3FREE, THYROIDAB in the last 72 hours. Anemia Panel: No results for input(s): VITAMINB12, FOLATE, FERRITIN, TIBC, IRON, RETICCTPCT in the last 72 hours. Sepsis Labs: Recent  Labs  Lab 10/13/24 2223 10/14/24 0553  PROCALCITON  --  0.63  LATICACIDVEN 0.9  --     Recent Results (from the past 240 hours)  Blood Culture (routine x 2)     Status: None (Preliminary result)   Collection Time: 10/13/24 10:40 PM   Specimen: BLOOD RIGHT ARM  Result Value Ref Range Status   Specimen Description BLOOD RIGHT ARM  Final   Special Requests   Final    BOTTLES DRAWN AEROBIC AND ANAEROBIC Blood Culture adequate volume   Culture   Final    NO GROWTH < 12 HOURS Performed at [facility], [address], [place], [place] [address]    Report Status PENDING  Incomplete  Blood Culture (routine x 2)     Status: None (Preliminary result)   Collection Time: 10/13/24 10:50 PM   Specimen: BLOOD  Result Value Ref Range Status   Specimen Description   Final    BLOOD LEFT ANTECUBITAL Performed at [facility], [address] [place], [place] [address]  Special Requests   Final    BOTTLES DRAWN AEROBIC AND ANAEROBIC Blood Culture adequate volume   Culture   Final    NO GROWTH < 12 HOURS Performed at [facility], [address], [place], [place] [address]    Report Status PENDING  Incomplete  Resp panel by RT-PCR (RSV, Flu A&B, Covid) Anterior Nasal Swab     Status: None   Collection Time: 10/14/24  3:37 AM   Specimen: Anterior Nasal Swab  Result Value Ref Range Status   SARS Coronavirus 2 by RT PCR NEGATIVE NEGATIVE Final    Comment: (NOTE) SARS-CoV-2 target nucleic acids are NOT DETECTED.  The SARS-CoV-2 RNA is generally detectable in upper respiratory specimens during the acute phase of infection. The lowest concentration of SARS-CoV-2 viral copies this assay can detect is 138 copies/mL. A negative result does not preclude SARS-Cov-2 infection and should not be used as the sole basis for treatment or other patient management decisions. A negative result may occur with  improper specimen collection/handling, submission of specimen other than nasopharyngeal  swab, presence of viral mutation(s) within the areas targeted by this assay, and inadequate number of viral copies(<138 copies/mL). A negative result must be combined with clinical observations, patient history, and epidemiological information. The expected result is Negative.  Fact Sheet for Patients:  bloggercourse.com  Fact Sheet for Healthcare Providers:  seriousbroker.it  This test is no t yet approved or cleared by the [place]  FDA and  has been authorized for detection and/or diagnosis of SARS-CoV-2 by FDA under an Emergency Use Authorization (EUA). This EUA will remain  in effect (meaning this test can be used) for the duration of the COVID-19 declaration under Section 564(b)(1) of the Act, 21 U.S.C.section 360bbb-3(b)(1), unless the authorization is terminated  or revoked sooner.       Influenza A by PCR NEGATIVE NEGATIVE Final   Influenza B by PCR NEGATIVE NEGATIVE Final    Comment: (NOTE) The Xpert Xpress SARS-CoV-2/FLU/RSV plus assay is intended as an aid in the diagnosis of influenza from Nasopharyngeal swab specimens and should not be used as a sole basis for treatment. Nasal washings and aspirates are unacceptable for Xpert Xpress SARS-CoV-2/FLU/RSV testing.  Fact Sheet for Patients: bloggercourse.com  Fact Sheet for Healthcare Providers: seriousbroker.it  This test is not yet approved or cleared by the [place]  FDA and has been authorized for detection and/or diagnosis of SARS-CoV-2 by FDA under an Emergency Use Authorization (EUA). This EUA will remain in effect (meaning this test can be used) for the duration of the COVID-19 declaration under Section 564(b)(1) of the Act, 21 U.S.C. section 360bbb-3(b)(1), unless the authorization is terminated or revoked.     Resp Syncytial Virus by PCR NEGATIVE NEGATIVE Final    Comment: (NOTE) Fact Sheet for  Patients: bloggercourse.com  Fact Sheet for Healthcare Providers: seriousbroker.it  This test is not yet approved or cleared by the [place]  FDA and has been authorized for detection and/or diagnosis of SARS-CoV-2 by FDA under an Emergency Use Authorization (EUA). This EUA will remain in effect (meaning this test can be used) for the duration of the COVID-19 declaration under Section 564(b)(1) of the Act, 21 U.S.C. section 360bbb-3(b)(1), unless the authorization is terminated or revoked.  Performed at [facility], [address] [place], [place] [address]          Radiology Studies: CT Angio Chest Pulmonary Embolism (PE) W or WO Contrast Result Date: 10/14/2024 CLINICAL DATA:  Shortness of breath and tachycardia.  Sepsis. EXAM: CT ANGIOGRAPHY CHEST CT ABDOMEN AND PELVIS WITH CONTRAST TECHNIQUE: Multidetector CT imaging of the chest was performed using the standard protocol during bolus administration of intravenous contrast. Multiplanar CT image reconstructions and MIPs were obtained to evaluate the vascular anatomy. Multidetector CT imaging of the abdomen and pelvis was performed using the standard protocol during bolus administration of intravenous contrast. RADIATION DOSE REDUCTION: This exam was performed according to the departmental dose-optimization program which includes automated exposure control, adjustment of the mA and/or kV according to patient size and/or use of iterative reconstruction technique. CONTRAST:  OMNIPAQUE  IOHEXOL  350 MG/ML SOLN COMPARISON:  None Available. FINDINGS: CTA CHEST FINDINGS Cardiovascular: The heart size is normal. No substantial pericardial effusion. No thoracic aortic aneurysm. No substantial atherosclerotic calcification of the thoracic aorta. There is no filling defect within the opacified pulmonary arteries to suggest the presence of an acute pulmonary embolus. Mediastinum/Nodes: No upper  mediastinal lymphadenopathy. 13 mm short axis distal paraesophageal lymph node evident there is no hilar lymphadenopathy. The esophagus has normal imaging features. There is no axillary lymphadenopathy. Lungs/Pleura: 5 mm parahilar right lung nodule identified on image 69/10. No other suspicious pulmonary nodule or mass. Dependent atelectasis noted in the lower lobes bilaterally. No dense focal airspace consolidation. No pulmonary edema or pleural effusion. Review of the MIP images confirms the above findings. CT ABDOMEN and PELVIS FINDINGS Hepatobiliary: No suspicious focal abnormality within the liver parenchyma. There is no evidence for gallstones, gallbladder wall thickening, or pericholecystic fluid. No intrahepatic or extrahepatic biliary dilation. Pancreas: No focal mass lesion. No dilatation of the main duct. No intraparenchymal cyst. No peripancreatic edema. Spleen: No splenomegaly. No suspicious focal mass lesion. Adrenals/Urinary Tract: No adrenal nodule or mass. 2 mm nonobstructing stone noted upper pole left kidney (coronal 120/6). Right kidney unremarkable. No evidence for hydroureter. The urinary bladder appears normal for the degree of distention. Stomach/Bowel: Stomach is unremarkable. No gastric wall thickening. No evidence of outlet obstruction. Duodenum is normally positioned as is the ligament of Treitz. No small bowel wall thickening. No small bowel dilatation. The terminal ileum is normal. The appendix is not well visualized, but there is no edema or inflammation in the region of the cecal tip to suggest appendicitis. No gross colonic mass. No colonic wall thickening. Vascular/Lymphatic: No abdominal aortic aneurysm. No abdominal aortic atherosclerotic calcification. Upper normal hepatoduodenal ligament lymph nodes. No retroperitoneal lymphadenopathy. No pelvic sidewall lymphadenopathy. Reproductive: The prostate gland and seminal vesicles are unremarkable. Other: No intraperitoneal free  fluid. Musculoskeletal: No worrisome lytic or sclerotic osseous abnormality. Review of the MIP images confirms the above findings. IMPRESSION: 1. No CT evidence for acute pulmonary embolus. 2. 13 mm short axis distal paraesophageal lymph node. This is nonspecific and may be reactive. Consider follow-up CT in 3 months to ensure stability. 3. 5 mm parahilar right lung nodule. This is almost assuredly benign given patient age. 4. No acute findings in the abdomen or pelvis. Electronically Signed   By: Camellia Candle M.D.   On: 10/14/2024 13:17   CT ABDOMEN PELVIS W CONTRAST Result Date: 10/14/2024 CLINICAL DATA:  Shortness of breath and tachycardia.  Sepsis. EXAM: CT ANGIOGRAPHY CHEST CT ABDOMEN AND PELVIS WITH CONTRAST TECHNIQUE: Multidetector CT imaging of the chest was performed using the standard protocol during bolus administration of intravenous contrast. Multiplanar CT image reconstructions and MIPs were obtained to evaluate the vascular anatomy. Multidetector CT imaging of the abdomen and pelvis was performed using the standard protocol during bolus administration of intravenous contrast.  RADIATION DOSE REDUCTION: This exam was performed according to the departmental dose-optimization program which includes automated exposure control, adjustment of the mA and/or kV according to patient size and/or use of iterative reconstruction technique. CONTRAST:  OMNIPAQUE  IOHEXOL  350 MG/ML SOLN COMPARISON:  None Available. FINDINGS: CTA CHEST FINDINGS Cardiovascular: The heart size is normal. No substantial pericardial effusion. No thoracic aortic aneurysm. No substantial atherosclerotic calcification of the thoracic aorta. There is no filling defect within the opacified pulmonary arteries to suggest the presence of an acute pulmonary embolus. Mediastinum/Nodes: No upper mediastinal lymphadenopathy. 13 mm short axis distal paraesophageal lymph node evident there is no hilar lymphadenopathy. The esophagus has normal  imaging features. There is no axillary lymphadenopathy. Lungs/Pleura: 5 mm parahilar right lung nodule identified on image 69/10. No other suspicious pulmonary nodule or mass. Dependent atelectasis noted in the lower lobes bilaterally. No dense focal airspace consolidation. No pulmonary edema or pleural effusion. Review of the MIP images confirms the above findings. CT ABDOMEN and PELVIS FINDINGS Hepatobiliary: No suspicious focal abnormality within the liver parenchyma. There is no evidence for gallstones, gallbladder wall thickening, or pericholecystic fluid. No intrahepatic or extrahepatic biliary dilation. Pancreas: No focal mass lesion. No dilatation of the main duct. No intraparenchymal cyst. No peripancreatic edema. Spleen: No splenomegaly. No suspicious focal mass lesion. Adrenals/Urinary Tract: No adrenal nodule or mass. 2 mm nonobstructing stone noted upper pole left kidney (coronal 120/6). Right kidney unremarkable. No evidence for hydroureter. The urinary bladder appears normal for the degree of distention. Stomach/Bowel: Stomach is unremarkable. No gastric wall thickening. No evidence of outlet obstruction. Duodenum is normally positioned as is the ligament of Treitz. No small bowel wall thickening. No small bowel dilatation. The terminal ileum is normal. The appendix is not well visualized, but there is no edema or inflammation in the region of the cecal tip to suggest appendicitis. No gross colonic mass. No colonic wall thickening. Vascular/Lymphatic: No abdominal aortic aneurysm. No abdominal aortic atherosclerotic calcification. Upper normal hepatoduodenal ligament lymph nodes. No retroperitoneal lymphadenopathy. No pelvic sidewall lymphadenopathy. Reproductive: The prostate gland and seminal vesicles are unremarkable. Other: No intraperitoneal free fluid. Musculoskeletal: No worrisome lytic or sclerotic osseous abnormality. Review of the MIP images confirms the above findings. IMPRESSION: 1. No CT  evidence for acute pulmonary embolus. 2. 13 mm short axis distal paraesophageal lymph node. This is nonspecific and may be reactive. Consider follow-up CT in 3 months to ensure stability. 3. 5 mm parahilar right lung nodule. This is almost assuredly benign given patient age. 4. No acute findings in the abdomen or pelvis. Electronically Signed   By: Camellia Candle M.D.   On: 10/14/2024 13:17   DG Chest Port 1 View Result Date: 10/13/2024 EXAM: 1 VIEW(S) XRAY OF THE CHEST 10/13/2024 11:01:35 PM COMPARISON: 10/11/2024 CLINICAL HISTORY: Fever and shortness of breath for several days. FINDINGS: LUNGS AND PLEURA: No focal pulmonary opacity. No pleural effusion. No pneumothorax. HEART AND MEDIASTINUM: No acute abnormality of the cardiac and mediastinal silhouettes. BONES AND SOFT TISSUES: No acute osseous abnormality. IMPRESSION: 1. No acute cardiopulmonary process. Electronically signed by: Oneil Devonshire MD 10/13/2024 11:07 PM EST RP Workstation: [id number]        Scheduled Meds: Continuous Infusions:  lactated ringers  150 mL/hr at 10/14/24 1048   piperacillin -tazobactam (ZOSYN )  IV 3.375 g (10/14/24 1237)   vancomycin             Derryl Duval, MD [facility] 10/14/2024, 1:39 PM

## 2024-10-14 NOTE — H&P (Addendum)
 History and Physical    Patient: Mitchell Cochran FMW:[id number] DOB: 11-30-1995 DOA: 10/13/2024 DOS: the patient was seen and examined on 10/14/2024 PCP: [name], [name]  Patient coming from: Home  Chief Complaint:  Chief Complaint  Patient presents with   Fever   Shortness of Breath   HPI: Mitchell Cochran is a 28 y.o. male with medical history significant of GERD who presents emergency department with onset of fever on 11/26, this resolved after about 4 days, fever returned after about 2 days and it was usually in the evening and was associated with diaphoresis.  On Sunday (12/7) he went to an urgent care and was started on doxycycline, he went to his PCP on Thursday (12/11) and was started on Z-Pak but continued to have fever with maximum temperature being 103F.  He has been taking Tylenol  and ibuprofen as needed for the fever.  He denies any sick contacts at home or at work and denies any tick bite, recent travel or exposure to wild animals.  Patient also denies chest pain, shortness of breath, nausea, vomiting, diarrhea.  ED course In the emergency department, he was tachycardic and tachypneic on arrival, temperature 99.2 F.  Workup showed normal CBC except thrombocytopenia.  CBG 122, AST 6 7, ALT 76.   Chest x-ray showed no acute cardiopulmonary process Empiric IV ceftriaxone  was given.  IV hydration was provided   Review of Systems: As mentioned in the history of present illness. All other systems reviewed and are negative. Past Medical History:  Diagnosis Date   GERD (gastroesophageal reflux disease)    Sleep apnea    Past Surgical History:  Procedure Laterality Date   BRAVO PH STUDY N/A 03/27/2024   Procedure: PH MONITORING, ESOPHAGUS, WIRELESS;  Surgeon: Cinderella Deatrice FALCON, MD;  Location: [facility];  Service: Endoscopy;  Laterality: N/A;  10:45AM;ASA 1-2   ESOPHAGOGASTRODUODENOSCOPY N/A 03/27/2024   Procedure: EGD (ESOPHAGOGASTRODUODENOSCOPY);  Surgeon: Cinderella Deatrice FALCON, MD;  Location: [facility];  Service: Endoscopy;  Laterality: N/A;  10:45AM;ASA 1-2 - Bravo   Social History:  reports that he has never smoked. He has never been exposed to tobacco smoke. He has quit using smokeless tobacco.  His smokeless tobacco use included snuff. He reports that he does not drink alcohol and does not use drugs.  Allergies[1]  Family History  Problem Relation Age of Onset   Diabetes Mother    Diabetes Father     Prior to Admission medications  Medication Sig Start Date End Date Taking? Authorizing Provider  albuterol  (VENTOLIN  HFA) 108 (90 Base) MCG/ACT inhaler Inhale 2 puffs into the lungs every 4 (four) hours as needed. 10/08/24   Stuart Vernell Norris, PA-C  azithromycin  (ZITHROMAX ) 250 MG tablet Take 2 tablets on day 1, then 1 tablet daily on days 2 through 5 10/12/24 10/17/24  Bevely Doffing, [name]  benzonatate  (TESSALON ) 200 MG capsule Take 1 capsule (200 mg total) by mouth 2 (two) times daily as needed for cough. 10/12/24   Bevely Doffing, [name]  doxycycline (VIBRA-TABS) 100 MG tablet Take 100 mg by mouth 2 (two) times daily. 10/07/24   [provider]  EPINEPHrine  0.3 mg/0.3 mL IJ SOAJ injection Inject 0.3 mg into the muscle as needed for anaphylaxis. 05/16/24   Suellen Cantor A, PA-C  omeprazole  (PRILOSEC) 40 MG capsule Take 1 capsule (40 mg total) by mouth in the morning and at bedtime. 04/30/24 10/12/24  Ahmed, Muhammad F, MD  predniSONE  (DELTASONE ) 10 MG tablet Take  2 tablets (20 mg total) by mouth daily. 10/08/24   Stuart Vernell Norris, PA-C  promethazine -dextromethorphan (PROMETHAZINE -DM) 6.25-15 MG/5ML syrup Take 5 mLs by mouth 4 (four) times daily as needed. 10/08/24   Stuart Vernell Norris, PA-C    Physical Exam: Vitals:   10/13/24 2151 10/13/24 2157 10/13/24 2200 10/13/24 2242  BP: (!) 123/93  120/84   Pulse: (!) 135 (!) 119 (!) 117   Resp:  (!) 24 (!) 23   Temp: 99.2 F (37.3 C)     TempSrc: Oral     SpO2:  96% 95%   Weight:    104.3  kg  Height:    6' (1.829 m)  General: Awake and alert and oriented x3. Not in any acute distress.  HEENT: NCAT.  PERRLA. EOMI. Sclerae anicteric.  Moist mucosal membranes. Neck: Neck supple without lymphadenopathy. No carotid bruits. No masses palpated.  Cardiovascular: Regular rate with normal S1-S2 sounds. No murmurs, rubs or gallops auscultated. No JVD.  Respiratory: Clear breath sounds.  No accessory muscle use. Abdomen: Soft, nontender, nondistended. Active bowel sounds. No masses or hepatosplenomegaly  Skin: No rashes, lesions, or ulcerations.  Dry, warm to touch. Musculoskeletal:  2+ dorsalis pedis and radial pulses. Good ROM.  No contractures  Psychiatric: Intact judgment and insight.  Mood appropriate to current condition. Neurologic: No focal neurological deficits. Strength is 5/5 x 4.  CN II - XII grossly intact.  Assessment and Plan: Fever, unspecified fever cause No known cause of this at this time Patient was empirically treated with IV ceftriaxone  IV vancomycin  and Zosyn  will be started will plan to de-escalate/discontinue based on blood culture and procalcitonin Continue as needed Toradol  as needed for fever.  Tylenol  temporarily held due to mild elevation in liver enzymes Respiratory panel, urinalysis and urine drug screen pending  Thrombocytopenia Platelets 72.  This may be reactive Continue to monitor platelet levels  Transaminitis AST 67, ALT 76 Patient denies abdominal pain.  Continue to monitor liver enzymes.  Obesity class I (BMI 31.19) Diet and lifestyle modification    Advance Care Planning: Full code  Consults: None  Family Communication: None at bedside  Severity of Illness: The appropriate patient status for this patient is INPATIENT. Inpatient status is judged to be reasonable and necessary in order to provide the required intensity of service to ensure the patient's safety. The patient's presenting symptoms, physical exam findings, and initial  radiographic and laboratory data in the context of their chronic comorbidities is felt to place them at high risk for further clinical deterioration. Furthermore, it is not anticipated that the patient will be medically stable for discharge from the hospital within 2 midnights of admission.   * I certify that at the point of admission it is my clinical judgment that the patient will require inpatient hospital care spanning beyond 2 midnights from the point of admission due to high intensity of service, high risk for further deterioration and high frequency of surveillance required.*  Author: Shelise Maron, DO 10/14/2024 12:09 AM  For on call review [email or web address].      [1]  Allergies Allergen Reactions   Bee Venom Anaphylaxis

## 2024-10-15 ENCOUNTER — Ambulatory Visit: Payer: Self-pay | Admitting: Family Medicine

## 2024-10-15 LAB — CBC WITH DIFFERENTIAL/PLATELET
Abs Immature Granulocytes: 0.01 K/uL (ref 0.00–0.07)
Basophils Absolute: 0 K/uL (ref 0.0–0.1)
Basophils Relative: 0 %
Eosinophils Absolute: 0 K/uL (ref 0.0–0.5)
Eosinophils Relative: 1 %
HCT: 36.5 % — ABNORMAL LOW (ref 39.0–52.0)
Hemoglobin: 12.2 g/dL — ABNORMAL LOW (ref 13.0–17.0)
Immature Granulocytes: 0 %
Lymphocytes Relative: 74 %
Lymphs Abs: 2.7 K/uL (ref 0.7–4.0)
MCH: 30.3 pg (ref 26.0–34.0)
MCHC: 33.4 g/dL (ref 30.0–36.0)
MCV: 90.6 fL (ref 80.0–100.0)
Monocytes Absolute: 0.2 K/uL (ref 0.1–1.0)
Monocytes Relative: 4 %
Neutro Abs: 0.8 K/uL — ABNORMAL LOW (ref 1.7–7.7)
Neutrophils Relative %: 21 %
Platelets: 65 K/uL — ABNORMAL LOW (ref 150–400)
RBC: 4.03 MIL/uL — ABNORMAL LOW (ref 4.22–5.81)
RDW: 12.7 % (ref 11.5–15.5)
WBC: 3.7 K/uL — ABNORMAL LOW (ref 4.0–10.5)
nRBC: 0 % (ref 0.0–0.2)

## 2024-10-15 LAB — RESPIRATORY PANEL BY PCR

## 2024-10-15 LAB — HEPATITIS PANEL, ACUTE
HCV Ab: NONREACTIVE
Hep A IgM: NONREACTIVE
Hep B C IgM: NONREACTIVE
Hepatitis B Surface Ag: NONREACTIVE

## 2024-10-15 LAB — COMPREHENSIVE METABOLIC PANEL WITH GFR
ALT: 87 U/L — ABNORMAL HIGH (ref 0–44)
AST: 64 U/L — ABNORMAL HIGH (ref 15–41)
Albumin: 3.6 g/dL (ref 3.5–5.0)
Alkaline Phosphatase: 100 U/L (ref 38–126)
Anion gap: 8 (ref 5–15)
BUN: 11 mg/dL (ref 6–20)
CO2: 26 mmol/L (ref 22–32)
Calcium: 8.6 mg/dL — ABNORMAL LOW (ref 8.9–10.3)
Chloride: 106 mmol/L (ref 98–111)
Creatinine, Ser: 0.94 mg/dL (ref 0.61–1.24)
GFR, Estimated: >60 mL/min (ref >60)
Glucose, Bld: 98 mg/dL (ref 70–99)
Potassium: 3.9 mmol/L (ref 3.5–5.1)
Sodium: 140 mmol/L (ref 135–145)
Total Bilirubin: 0.6 mg/dL (ref 0.0–1.2)
Total Protein: 6.3 g/dL — ABNORMAL LOW (ref 6.5–8.1)

## 2024-10-15 LAB — HIV ANTIBODY (ROUTINE TESTING W REFLEX): HIV Screen 4th Generation wRfx: NONREACTIVE

## 2024-10-15 LAB — MRSA NEXT GEN BY PCR, NASAL: MRSA by PCR Next Gen: DETECTED — AB

## 2024-10-15 LAB — SEDIMENTATION RATE: Sed Rate: 53 mm/h — ABNORMAL HIGH (ref 0–15)

## 2024-10-15 LAB — C-REACTIVE PROTEIN: CRP: 14.1 mg/dL — ABNORMAL HIGH (ref <1.0)

## 2024-10-15 LAB — LACTATE DEHYDROGENASE: LDH: 634 U/L — ABNORMAL HIGH (ref 105–235)

## 2024-10-15 NOTE — Plan of Care (Signed)
°  Problem: Clinical Measurements: °Goal: Ability to maintain clinical measurements within normal limits will improve °Outcome: Progressing °  °Problem: Clinical Measurements: °Goal: Diagnostic test results will improve °Outcome: Progressing °  °

## 2024-10-15 NOTE — Progress Notes (Signed)
 PROGRESS NOTE    GIOVAN PINSKY  FMW:[id number] DOB: 03-Aug-1996 DOA: 10/13/2024 PCP: [name], [name]   Brief Narrative:    DARICK FETTERS is a 28 y.o. male with medical history significant of GERD who presents emergency department with onset of fever on 11/26, this resolved after about 4 days, fever returned after about 2 days and it was usually in the evening and was associated with diaphoresis.  On Sunday (12/7) he went to an urgent care and was started on doxycycline, he went to his PCP on Thursday (12/11) and was started on Z-Pak but continued to have fever with maximum temperature being 103F.  He has been taking Tylenol  and ibuprofen as needed for the fever.  He denies any sick contacts at home or at work and denies any tick bite, recent travel or exposure to wild animals.  Patient also denies chest pain, shortness of breath, nausea, vomiting, diarrhea.  Patient was empirically started on broad-spectrum IV antibiotics and there has been no growth on blood cultures to date.  Discussed case with ID, Dr. Overton who recommends transfer to [facility] for further evaluation at bedside.  Other lab work ordered as noted below.  Assessment & Plan:   Principal Problem:   Fever  Assessment and Plan:   Fever of unknown origin Procalcitonin 0.6 Chest x-ray negative Respiratory panel virus negative, full respiratory panel pending CT studies unremarkable except for some reactive lymphadenopathy Parvovirus, EBV, CMV, LDH studies ordered as well as ESR, CRP, rheumatoid factor, and lupus workup HIV and hepatitis panel ordered and pending -Antibiotics discontinued 12/15   Thrombocytopenia progressed to pancytopenia Appreciate ID workup and may require bone marrow biopsy for further evaluation Continue monitor labs   Transaminitis Hepatitis panel ordered and pending   Obesity class I (BMI 31.19) Diet and lifestyle modification    DVT prophylaxis: SCDs Code Status: Full Family  Communication: Spouse at bedside 12/15 Disposition Plan: Transfer to [facility] for further evaluation per ID. Status is: Inpatient Remains inpatient appropriate because: Need for further inpatient workup.   Consultants:  ID, discussed case with Dr. Overton  Procedures:  None  Antimicrobials:  Anti-infectives (From admission, onward)    Start     Dose/Rate Route Frequency Ordered Stop   10/14/24 1800  vancomycin  (VANCOREADY) IVPB 2000 mg/400 mL  Status:  Discontinued        2,000 mg 200 mL/hr over 120 Minutes Intravenous Every 12 hours 10/14/24 0325 10/15/24 1431   10/14/24 0330  piperacillin -tazobactam (ZOSYN ) IVPB 3.375 g  Status:  Discontinued        3.375 g 12.5 mL/hr over 240 Minutes Intravenous Every 8 hours 10/14/24 0317 10/15/24 1431   10/14/24 0315  vancomycin  (VANCOREADY) IVPB 2000 mg/400 mL        2,000 mg 200 mL/hr over 120 Minutes Intravenous  Once 10/14/24 0314 10/14/24 0551   10/13/24 2230  cefTRIAXone  (ROCEPHIN ) 1 g in sodium chloride  0.9 % 100 mL IVPB        1 g 200 mL/hr over 30 Minutes Intravenous  Once 10/13/24 2227 10/13/24 2321       Subjective: Patient seen and evaluated today with no new acute complaints or concerns. No acute concerns or events noted overnight.  He continues to have ongoing low-grade fevers.  Objective: Vitals:   10/14/24 2113 10/14/24 2358 10/15/24 0427 10/15/24 1348  BP: 114/66 123/80 112/70 126/77  Pulse: 91 (!) 109 85 98  Resp: 18 18 18 17   Temp: 98.3 F (  36.8 C) 98.3 F (36.8 C) 98.4 F (36.9 C) 99.9 F (37.7 C)  TempSrc: Oral Oral Oral Oral  SpO2: 99% 100% 98% 100%  Weight:      Height:        Intake/Output Summary (Last 24 hours) at 10/15/2024 1435 Last data filed at 10/14/2024 1749 Gross per 24 hour  Intake 1064.35 ml  Output --  Net 1064.35 ml   Filed Weights   10/13/24 2242 10/14/24 1050  Weight: 104.3 kg 105.6 kg    Examination:  General exam: Appears calm and comfortable  Respiratory system: Clear to  auscultation. Respiratory effort normal. Cardiovascular system: S1 & S2 heard, RRR.  Gastrointestinal system: Abdomen is soft Central nervous system: Alert and awake Extremities: No edema Skin: No significant lesions noted Psychiatry: Flat affect.    Data Reviewed: I have personally reviewed following labs and imaging studies  CBC: Recent Labs  Lab 10/13/24 2223 10/14/24 0553 10/15/24 0432  WBC 4.6 4.6 3.7*  NEUTROABS 0.8*  --  0.8*  HGB 13.5 12.6* 12.2*  HCT 39.2 37.1* 36.5*  MCV 88.5 90.0 90.6  PLT 77* 73* 65*   Basic Metabolic Panel: Recent Labs  Lab 10/13/24 2223 10/14/24 0553 10/15/24 0432  NA 139 139 140  K 3.5 3.8 3.9  CL 104 105 106  CO2 26 24 26   GLUCOSE 122* 87 98  BUN 15 13 11   CREATININE 0.94 0.85 0.94  CALCIUM 8.8* 8.6* 8.6*  MG  --  2.1  --   PHOS  --  2.6  --    GFR: Estimated Creatinine Clearance: 144.3 mL/min (by C-G formula based on SCr of 0.94 mg/dL). Liver Function Tests: Recent Labs  Lab 10/13/24 2223 10/14/24 0553 10/15/24 0432  AST 67* 64* 64*  ALT 76* 72* 87*  ALKPHOS 109 98 100  BILITOT 0.9 0.8 0.6  PROT 6.7 6.5 6.3*  ALBUMIN 3.8 3.7 3.6   No results for input(s): LIPASE, AMYLASE in the last 168 hours. No results for input(s): AMMONIA in the last 168 hours. Coagulation Profile: No results for input(s): INR, PROTIME in the last 168 hours. Cardiac Enzymes: No results for input(s): CKTOTAL, CKMB, CKMBINDEX, TROPONINI in the last 168 hours. BNP (last 3 results) No results for input(s): PROBNP in the last 8760 hours. HbA1C: No results for input(s): HGBA1C in the last 72 hours. CBG: No results for input(s): GLUCAP in the last 168 hours. Lipid Profile: No results for input(s): CHOL, HDL, LDLCALC, TRIG, CHOLHDL, LDLDIRECT in the last 72 hours. Thyroid Function Tests: No results for input(s): TSH, T4TOTAL, FREET4, T3FREE, THYROIDAB in the last 72 hours. Anemia Panel: No results for  input(s): VITAMINB12, FOLATE, FERRITIN, TIBC, IRON, RETICCTPCT in the last 72 hours. Sepsis Labs: Recent Labs  Lab 10/13/24 2223 10/14/24 0553  PROCALCITON  --  0.63  LATICACIDVEN 0.9  --     Recent Results (from the past 240 hours)  Blood Culture (routine x 2)     Status: None (Preliminary result)   Collection Time: 10/13/24 10:40 PM   Specimen: BLOOD RIGHT ARM  Result Value Ref Range Status   Specimen Description BLOOD RIGHT ARM  Final   Special Requests   Final    BOTTLES DRAWN AEROBIC AND ANAEROBIC Blood Culture adequate volume   Culture   Final    NO GROWTH 2 DAYS Performed at [facility], [address], [place], [place] [address]    Report Status PENDING  Incomplete  Blood Culture (routine x 2)  Status: None (Preliminary result)   Collection Time: 10/13/24 10:50 PM   Specimen: BLOOD  Result Value Ref Range Status   Specimen Description   Final    BLOOD LEFT ANTECUBITAL Performed at [facility], [address] [place], [place] [address]    Special Requests   Final    BOTTLES DRAWN AEROBIC AND ANAEROBIC Blood Culture adequate volume   Culture   Final    NO GROWTH 2 DAYS Performed at [facility], [address] [place], [place] [address]    Report Status PENDING  Incomplete  Resp panel by RT-PCR (RSV, Flu A&B, Covid) Anterior Nasal Swab     Status: None   Collection Time: 10/14/24  3:37 AM   Specimen: Anterior Nasal Swab  Result Value Ref Range Status   SARS Coronavirus 2 by RT PCR NEGATIVE NEGATIVE Final    Comment: (NOTE) SARS-CoV-2 target nucleic acids are NOT DETECTED.  The SARS-CoV-2 RNA is generally detectable in upper respiratory specimens during the acute phase of infection. The lowest concentration of SARS-CoV-2 viral copies this assay can detect is 138 copies/mL. A negative result does not preclude SARS-Cov-2 infection and should not be used as the sole basis for treatment or other patient management decisions. A negative result  may occur with  improper specimen collection/handling, submission of specimen other than nasopharyngeal swab, presence of viral mutation(s) within the areas targeted by this assay, and inadequate number of viral copies(<138 copies/mL). A negative result must be combined with clinical observations, patient history, and epidemiological information. The expected result is Negative.  Fact Sheet for Patients:  bloggercourse.com  Fact Sheet for Healthcare Providers:  seriousbroker.it  This test is no t yet approved or cleared by the [place]  FDA and  has been authorized for detection and/or diagnosis of SARS-CoV-2 by FDA under an Emergency Use Authorization (EUA). This EUA will remain  in effect (meaning this test can be used) for the duration of the COVID-19 declaration under Section 564(b)(1) of the Act, 21 U.S.C.section 360bbb-3(b)(1), unless the authorization is terminated  or revoked sooner.       Influenza A by PCR NEGATIVE NEGATIVE Final   Influenza B by PCR NEGATIVE NEGATIVE Final    Comment: (NOTE) The Xpert Xpress SARS-CoV-2/FLU/RSV plus assay is intended as an aid in the diagnosis of influenza from Nasopharyngeal swab specimens and should not be used as a sole basis for treatment. Nasal washings and aspirates are unacceptable for Xpert Xpress SARS-CoV-2/FLU/RSV testing.  Fact Sheet for Patients: bloggercourse.com  Fact Sheet for Healthcare Providers: seriousbroker.it  This test is not yet approved or cleared by the [place]  FDA and has been authorized for detection and/or diagnosis of SARS-CoV-2 by FDA under an Emergency Use Authorization (EUA). This EUA will remain in effect (meaning this test can be used) for the duration of the COVID-19 declaration under Section 564(b)(1) of the Act, 21 U.S.C. section 360bbb-3(b)(1), unless the authorization is terminated  or revoked.     Resp Syncytial Virus by PCR NEGATIVE NEGATIVE Final    Comment: (NOTE) Fact Sheet for Patients: bloggercourse.com  Fact Sheet for Healthcare Providers: seriousbroker.it  This test is not yet approved or cleared by the [place]  FDA and has been authorized for detection and/or diagnosis of SARS-CoV-2 by FDA under an Emergency Use Authorization (EUA). This EUA will remain in effect (meaning this test can be used) for the duration of the COVID-19 declaration under Section 564(b)(1) of the Act, 21 U.S.C. section 360bbb-3(b)(1), unless the  authorization is terminated or revoked.  Performed at [facility], [address] [place], [place] [address]   MRSA Next Gen by PCR, Nasal     Status: Abnormal   Collection Time: 10/15/24  9:53 AM   Specimen: Nasal Mucosa; Nasal Swab  Result Value Ref Range Status   MRSA by PCR Next Gen DETECTED (A) NOT DETECTED Final    Comment: CRITICAL RESULT CALLED TO, READ BACK BY AND VERIFIED WITH: C. KELLAM AT 1220 ON 12.15.25 BY ADGER J         The GeneXpert MRSA Assay (FDA approved for NASAL specimens only), is one component of a comprehensive MRSA colonization surveillance program. It is not intended to diagnose MRSA infection nor to guide or monitor treatment for MRSA infections. Performed at [facility], [address], [place], [place] [address]          Radiology Studies: CT Angio Chest Pulmonary Embolism (PE) W or WO Contrast Result Date: 10/14/2024 CLINICAL DATA:  Shortness of breath and tachycardia.  Sepsis. EXAM: CT ANGIOGRAPHY CHEST CT ABDOMEN AND PELVIS WITH CONTRAST TECHNIQUE: Multidetector CT imaging of the chest was performed using the standard protocol during bolus administration of intravenous contrast. Multiplanar CT image reconstructions and MIPs were obtained to evaluate the vascular anatomy. Multidetector CT imaging of the abdomen and pelvis was performed  using the standard protocol during bolus administration of intravenous contrast. RADIATION DOSE REDUCTION: This exam was performed according to the departmental dose-optimization program which includes automated exposure control, adjustment of the mA and/or kV according to patient size and/or use of iterative reconstruction technique. CONTRAST:  OMNIPAQUE  IOHEXOL  350 MG/ML SOLN COMPARISON:  None Available. FINDINGS: CTA CHEST FINDINGS Cardiovascular: The heart size is normal. No substantial pericardial effusion. No thoracic aortic aneurysm. No substantial atherosclerotic calcification of the thoracic aorta. There is no filling defect within the opacified pulmonary arteries to suggest the presence of an acute pulmonary embolus. Mediastinum/Nodes: No upper mediastinal lymphadenopathy. 13 mm short axis distal paraesophageal lymph node evident there is no hilar lymphadenopathy. The esophagus has normal imaging features. There is no axillary lymphadenopathy. Lungs/Pleura: 5 mm parahilar right lung nodule identified on image 69/10. No other suspicious pulmonary nodule or mass. Dependent atelectasis noted in the lower lobes bilaterally. No dense focal airspace consolidation. No pulmonary edema or pleural effusion. Review of the MIP images confirms the above findings. CT ABDOMEN and PELVIS FINDINGS Hepatobiliary: No suspicious focal abnormality within the liver parenchyma. There is no evidence for gallstones, gallbladder wall thickening, or pericholecystic fluid. No intrahepatic or extrahepatic biliary dilation. Pancreas: No focal mass lesion. No dilatation of the main duct. No intraparenchymal cyst. No peripancreatic edema. Spleen: No splenomegaly. No suspicious focal mass lesion. Adrenals/Urinary Tract: No adrenal nodule or mass. 2 mm nonobstructing stone noted upper pole left kidney (coronal 120/6). Right kidney unremarkable. No evidence for hydroureter. The urinary bladder appears normal for the degree of  distention. Stomach/Bowel: Stomach is unremarkable. No gastric wall thickening. No evidence of outlet obstruction. Duodenum is normally positioned as is the ligament of Treitz. No small bowel wall thickening. No small bowel dilatation. The terminal ileum is normal. The appendix is not well visualized, but there is no edema or inflammation in the region of the cecal tip to suggest appendicitis. No gross colonic mass. No colonic wall thickening. Vascular/Lymphatic: No abdominal aortic aneurysm. No abdominal aortic atherosclerotic calcification. Upper normal hepatoduodenal ligament lymph nodes. No retroperitoneal lymphadenopathy. No pelvic sidewall lymphadenopathy. Reproductive: The prostate gland and seminal vesicles are unremarkable.  Other: No intraperitoneal free fluid. Musculoskeletal: No worrisome lytic or sclerotic osseous abnormality. Review of the MIP images confirms the above findings. IMPRESSION: 1. No CT evidence for acute pulmonary embolus. 2. 13 mm short axis distal paraesophageal lymph node. This is nonspecific and may be reactive. Consider follow-up CT in 3 months to ensure stability. 3. 5 mm parahilar right lung nodule. This is almost assuredly benign given patient age. 4. No acute findings in the abdomen or pelvis. Electronically Signed   By: Camellia Candle M.D.   On: 10/14/2024 13:17   CT ABDOMEN PELVIS W CONTRAST Result Date: 10/14/2024 CLINICAL DATA:  Shortness of breath and tachycardia.  Sepsis. EXAM: CT ANGIOGRAPHY CHEST CT ABDOMEN AND PELVIS WITH CONTRAST TECHNIQUE: Multidetector CT imaging of the chest was performed using the standard protocol during bolus administration of intravenous contrast. Multiplanar CT image reconstructions and MIPs were obtained to evaluate the vascular anatomy. Multidetector CT imaging of the abdomen and pelvis was performed using the standard protocol during bolus administration of intravenous contrast. RADIATION DOSE REDUCTION: This exam was performed according to  the departmental dose-optimization program which includes automated exposure control, adjustment of the mA and/or kV according to patient size and/or use of iterative reconstruction technique. CONTRAST:  OMNIPAQUE  IOHEXOL  350 MG/ML SOLN COMPARISON:  None Available. FINDINGS: CTA CHEST FINDINGS Cardiovascular: The heart size is normal. No substantial pericardial effusion. No thoracic aortic aneurysm. No substantial atherosclerotic calcification of the thoracic aorta. There is no filling defect within the opacified pulmonary arteries to suggest the presence of an acute pulmonary embolus. Mediastinum/Nodes: No upper mediastinal lymphadenopathy. 13 mm short axis distal paraesophageal lymph node evident there is no hilar lymphadenopathy. The esophagus has normal imaging features. There is no axillary lymphadenopathy. Lungs/Pleura: 5 mm parahilar right lung nodule identified on image 69/10. No other suspicious pulmonary nodule or mass. Dependent atelectasis noted in the lower lobes bilaterally. No dense focal airspace consolidation. No pulmonary edema or pleural effusion. Review of the MIP images confirms the above findings. CT ABDOMEN and PELVIS FINDINGS Hepatobiliary: No suspicious focal abnormality within the liver parenchyma. There is no evidence for gallstones, gallbladder wall thickening, or pericholecystic fluid. No intrahepatic or extrahepatic biliary dilation. Pancreas: No focal mass lesion. No dilatation of the main duct. No intraparenchymal cyst. No peripancreatic edema. Spleen: No splenomegaly. No suspicious focal mass lesion. Adrenals/Urinary Tract: No adrenal nodule or mass. 2 mm nonobstructing stone noted upper pole left kidney (coronal 120/6). Right kidney unremarkable. No evidence for hydroureter. The urinary bladder appears normal for the degree of distention. Stomach/Bowel: Stomach is unremarkable. No gastric wall thickening. No evidence of outlet obstruction. Duodenum is normally positioned as is  the ligament of Treitz. No small bowel wall thickening. No small bowel dilatation. The terminal ileum is normal. The appendix is not well visualized, but there is no edema or inflammation in the region of the cecal tip to suggest appendicitis. No gross colonic mass. No colonic wall thickening. Vascular/Lymphatic: No abdominal aortic aneurysm. No abdominal aortic atherosclerotic calcification. Upper normal hepatoduodenal ligament lymph nodes. No retroperitoneal lymphadenopathy. No pelvic sidewall lymphadenopathy. Reproductive: The prostate gland and seminal vesicles are unremarkable. Other: No intraperitoneal free fluid. Musculoskeletal: No worrisome lytic or sclerotic osseous abnormality. Review of the MIP images confirms the above findings. IMPRESSION: 1. No CT evidence for acute pulmonary embolus. 2. 13 mm short axis distal paraesophageal lymph node. This is nonspecific and may be reactive. Consider follow-up CT in 3 months to ensure stability. 3. 5 mm parahilar right lung nodule.  This is almost assuredly benign given patient age. 4. No acute findings in the abdomen or pelvis. Electronically Signed   By: Camellia Candle M.D.   On: 10/14/2024 13:17   DG Chest Port 1 View Result Date: 10/13/2024 EXAM: 1 VIEW(S) XRAY OF THE CHEST 10/13/2024 11:01:35 PM COMPARISON: 10/11/2024 CLINICAL HISTORY: Fever and shortness of breath for several days. FINDINGS: LUNGS AND PLEURA: No focal pulmonary opacity. No pleural effusion. No pneumothorax. HEART AND MEDIASTINUM: No acute abnormality of the cardiac and mediastinal silhouettes. BONES AND SOFT TISSUES: No acute osseous abnormality. IMPRESSION: 1. No acute cardiopulmonary process. Electronically signed by: Oneil Devonshire MD 10/13/2024 11:07 PM EST RP Workstation: [id number]      LOS: 1 day    Time spent: 55 minutes    Marwah Disbro JONETTA Fairly, DO [facility]  If 7PM-7AM, please contact night-coverage [email or web address] 10/15/2024, 2:35 PM

## 2024-10-15 NOTE — Plan of Care (Signed)
 Spoke with dr Maree from [facility]   Patient with FUO presentation now with pancytopenia. Has had abx including doxycycline   Pending several id w/u   Not hemodynamically compromised   Ct chest abd pelv contrast 1. No CT evidence for acute pulmonary embolus. 2. 13 mm short axis distal paraesophageal lymph node. This is nonspecific and may be reactive. Consider follow-up CT in 3 months to ensure stability. 3. 5 mm parahilar right lung nodule. This is almost assuredly benign given patient age. 4. No acute findings in the abdomen or pelvis.   12/15 mrsa nares positive  12/14 flu/rsv covid pcr negative  12/13 bcx ngtd  Lab Results  Component Value Date   WBC 3.7 (L) 10/15/2024   HGB 12.2 (L) 10/15/2024   HCT 36.5 (L) 10/15/2024   MCV 90.6 10/15/2024   PLT 65 (L) 10/15/2024   Last metabolic panel Lab Results  Component Value Date   GLUCOSE 98 10/15/2024   NA 140 10/15/2024   K 3.9 10/15/2024   CL 106 10/15/2024   CO2 26 10/15/2024   BUN 11 10/15/2024   CREATININE 0.94 10/15/2024   GFRNONAA >60 10/15/2024   CALCIUM 8.6 (L) 10/15/2024   PHOS 2.6 10/14/2024   PROT 6.3 (L) 10/15/2024   ALBUMIN 3.6 10/15/2024   LABGLOB 2.5 03/19/2024   BILITOT 0.6 10/15/2024   ALKPHOS 100 10/15/2024   AST 64 (H) 10/15/2024   ALT 87 (H) 10/15/2024   ANIONGAP 8 10/15/2024       -28 yo male fever > 2 weeks and now also with transaminitis and pancytopenia, without obvious sick contact/tick bite/animal exposure or travel   Ddx keeping broad infectious, rheumatologic/malignancy, iatrogenic including medication related, secondary HLH  Differential no eosinophilia or blast and inflammatory granules mentioned   Viral >>> bacterial > atypical process such as tb/fungal >> parasite   Most of these are viral processes and duration within that duration of sx   -full respiratory viral pcr -cmv, ebv serology and pcr -parvovirus pcr -hiv pcr and antibody -hepatitis panel -f/u  blood cx  -would send rheumatologic labs, ldh, and  -would also send coagulation panel   -please discuss case with hematology as well  -await patient transfer to [place] for full thorough hx and exam prior to other w/u   -would defer antibiotics for now

## 2024-10-16 DIAGNOSIS — B182 Chronic viral hepatitis C: Secondary | ICD-10-CM

## 2024-10-16 DIAGNOSIS — R7401 Elevation of levels of liver transaminase levels: Secondary | ICD-10-CM

## 2024-10-16 DIAGNOSIS — D696 Thrombocytopenia, unspecified: Secondary | ICD-10-CM

## 2024-10-16 LAB — CBC
HCT: 41.3 % (ref 39.0–52.0)
Hemoglobin: 13.4 g/dL (ref 13.0–17.0)
MCH: 30.7 pg (ref 26.0–34.0)
MCHC: 32.4 g/dL (ref 30.0–36.0)
MCV: 94.5 fL (ref 80.0–100.0)
Platelets: 40 K/uL — ABNORMAL LOW (ref 150–400)
RBC: 4.37 MIL/uL (ref 4.22–5.81)
RDW: 12.8 % (ref 11.5–15.5)
WBC: 4 K/uL (ref 4.0–10.5)
nRBC: 0 % (ref 0.0–0.2)

## 2024-10-16 LAB — VERITOR FLU A/B WAIVED
Influenza A: NEGATIVE
Influenza B: NEGATIVE

## 2024-10-16 LAB — COMPREHENSIVE METABOLIC PANEL WITH GFR
ALT: 74 U/L — ABNORMAL HIGH (ref 0–44)
AST: 47 U/L — ABNORMAL HIGH (ref 15–41)
Albumin: 3.7 g/dL (ref 3.5–5.0)
Alkaline Phosphatase: 101 U/L (ref 38–126)
Anion gap: 11 (ref 5–15)
BUN: 11 mg/dL (ref 6–20)
CO2: 22 mmol/L (ref 22–32)
Calcium: 8.9 mg/dL (ref 8.9–10.3)
Chloride: 106 mmol/L (ref 98–111)
Creatinine, Ser: 0.86 mg/dL (ref 0.61–1.24)
GFR, Estimated: >60 mL/min (ref >60)
Glucose, Bld: 91 mg/dL (ref 70–99)
Potassium: 3.9 mmol/L (ref 3.5–5.1)
Sodium: 139 mmol/L (ref 135–145)
Total Bilirubin: 0.6 mg/dL (ref 0.0–1.2)
Total Protein: 6.9 g/dL (ref 6.5–8.1)

## 2024-10-16 LAB — MAGNESIUM: Magnesium: 2.5 mg/dL — ABNORMAL HIGH (ref 1.7–2.4)

## 2024-10-16 LAB — EXTRACTABLE NUCLEAR ANTIGEN ANTIBODY
ENA SM Ab Ser-aCnc: <0.2 AI (ref 0.0–0.9)
Ribonucleic Protein: 0.3 AI (ref 0.0–0.9)
SSA (Ro) (ENA) Antibody, IgG: 0.2 AI (ref 0.0–0.9)
SSB (La) (ENA) Antibody, IgG: <0.2 AI (ref 0.0–0.9)
Scleroderma (Scl-70) (ENA) Antibody, IgG: 0.3 AI (ref 0.0–0.9)
ds DNA Ab: <1 [IU]/mL (ref 0–9)

## 2024-10-16 LAB — SAVE SMEAR(SSMR), FOR PROVIDER SLIDE REVIEW

## 2024-10-16 LAB — ANTI-JO 1 ANTIBODY, IGG: Anti JO-1: <0.2 AI (ref 0.0–0.9)

## 2024-10-16 NOTE — Consult Note (Addendum)
 [facility]    Date of Admission:  10/13/2024     Reason for Consult: fever of unknown origin/pancytopenia    Referring Provider: Maree Bracken     Lines:  Peripheral iv's  Abx: 12/14-15 vanc 12/14-15 piptazo 12/13 ceftriaxone    Outpatient 3 days doxycycline and 1 day azithromycin         Assessment: 28 yo male (spouse a nurse at [employer]), married with young children, admitted for sepsis syndrome and pancytopenia/hepatitis  No hx splenectomy  Sx onset since after thanksgiving and started with nonspecific malaise/fever/decreased appetite not improved on 3 day doxy  Labs showed mainly thrombocytopenic process with 2 days mild anemia and mild leukopenia which resolved outside of the thrombocytopenia  He also subjectively feels better  Imaging nonsepcific 1 small lung nodule and paraesophageal LAD  Exposure/epidemiology and lack of response to doxy suggest viral process that is perhaps coming to a tail end  Low suspicion for bacterial process including tick (wrong season and sx not consistent with), tb/endemic fungi, hematologic/rheumatologic process  He looks rather well for hlh  Sx evolution do not suggest a secondary lupus process   Discussed with primary team and hematology team    Micro: 12/13 bcx negative 12/15 resp viral pcr negative  Plan: Coag panel, ebv/cmv, hiv, parvo virus pcr, hepatitis serology Blood smear to be reviewed by hematology; flow cytometry added Maintain standard isolation precaution No abx If continued improvement in 2 days could discharge from id standpoint I have made id follow up appointment for patient on 1/20 @ 3pm Discussed with primary team   I connected with  Mitchell Cochran on 10/16/2024  I verified that I was speaking with the correct person using two identifiers. Due to the location, this service was provided via telemedicine using audio/visual media.   The patient was located  at [facility] The provider was located in [facility] The patient did consent to this visit and is aware of charges through their insurance as well as the limitations of evaluation and management by telemedicine. Other persons participating in this telemedicine service were none. Time spent on visit was greater than 80 minutes on media and in coordination of care   ------------------------------------------------ Principal Problem:   Fever    HPI: Mitchell Cochran is a 28 y.o. male here with sepsis syndrome  11/26 starts Malaise Fever 3 days Night sweat 12/1 continuous daily fever till 12/15  Dry cough prominent improved now, malaise also improved last 24 hours.  Appetite also picking up  No sorethroat, runny nose, headache, conjunctivitis, n/v/d No joint pain/swelling  No chest pain, abd pain  Seeked urgent care 10 days prior to my interview doxy - significant nausea/gi upset -- took 3 days no change in sx Primary care 5 days ago given zpack didn't do anything either  Lives with wife, who is fine 6 months, 2 yrs, 12 yrs. 12 yro goes to school; no other kids in day care. No body else seems sick at home Went to [place] [employer] 1 day prior to symptom onset. Didn't eat anything at [employer] but outside.   No new medication. Takes omeprazole . No other rx or otc or herbal medications Has 2 dogs for 6 years - indoor  Wife is a engineer, civil (consulting)   Patient is emergency planning/management officer. Been working up to [employer]. No physical altercation within work.   No recent immunizations -- none  this year.   No particular hobby including exotic food making/consumption.   No prior surgery/spleen intact    Febrile here once on presentation No leukocytosis Thrombocytopenia and down trending; briefly anemic/leukopenic and the latter have resolved Ct scan body rather unremarkable Started on bsAbx  Resp viral pcr and bcx negative so far   Family History  Problem  Relation Age of Onset   Diabetes Mother    Diabetes Father     Social History[1]  Allergies[2]  Review of Systems: ROS All Other ROS was negative, except mentioned above   Past Medical History:  Diagnosis Date   GERD (gastroesophageal reflux disease)    Sleep apnea        Scheduled Meds: Continuous Infusions: PRN Meds:.acetaminophen  **OR** acetaminophen , ketorolac , ondansetron  **OR** ondansetron  (ZOFRAN ) IV, mouth rinse   OBJECTIVE: Blood pressure 117/76, pulse 81, temperature 98.4 F (36.9 C), temperature source Oral, resp. rate 18, height 6' (1.829 m), weight 105.6 kg, SpO2 98%.  Physical Exam  General/constitutional: no distress, pleasant HEENT: Normocephalic, PER, Conj Clear, EOMI, Oropharynx clear Neck supple CV: rrr no mrg Lungs: clear to auscultation, normal respiratory effort Abd: Soft, Nontender Ext: no edema Skin: No Rash Neuro: nonfocal MSK: no peripheral joint swelling/tenderness/warmth; back spines nontender    Lab Results Lab Results  Component Value Date   WBC 4.0 10/16/2024   HGB 13.4 10/16/2024   HCT 41.3 10/16/2024   MCV 94.5 10/16/2024   PLT 40 (L) 10/16/2024    Lab Results  Component Value Date   CREATININE 0.86 10/16/2024   BUN 11 10/16/2024   NA 139 10/16/2024   K 3.9 10/16/2024   CL 106 10/16/2024   CO2 22 10/16/2024    Lab Results  Component Value Date   ALT 74 (H) 10/16/2024   AST 47 (H) 10/16/2024   ALKPHOS 101 10/16/2024   BILITOT 0.6 10/16/2024      Microbiology: Recent Results (from the past 240 hours)  Blood Culture (routine x 2)     Status: None (Preliminary result)   Collection Time: 10/13/24 10:40 PM   Specimen: BLOOD RIGHT ARM  Result Value Ref Range Status   Specimen Description BLOOD RIGHT ARM  Final   Special Requests   Final    BOTTLES DRAWN AEROBIC AND ANAEROBIC Blood Culture adequate volume   Culture   Final    NO GROWTH 3 DAYS Performed at [facility], [address] [place], [place]  [address]    Report Status PENDING  Incomplete  Blood Culture (routine x 2)     Status: None (Preliminary result)   Collection Time: 10/13/24 10:50 PM   Specimen: BLOOD  Result Value Ref Range Status   Specimen Description   Final    BLOOD LEFT ANTECUBITAL Performed at [facility], [address], [place], [place] [address]    Special Requests   Final    BOTTLES DRAWN AEROBIC AND ANAEROBIC Blood Culture adequate volume   Culture   Final    NO GROWTH 3 DAYS Performed at [facility], [address], [place], [place] [address]    Report Status PENDING  Incomplete  Resp panel by RT-PCR (RSV, Flu A&B, Covid) Anterior Nasal Swab     Status: None   Collection Time: 10/14/24  3:37 AM   Specimen: Anterior Nasal Swab  Result Value Ref Range Status   SARS Coronavirus 2 by RT PCR NEGATIVE NEGATIVE Final    Comment: (NOTE) SARS-CoV-2 target nucleic acids are NOT DETECTED.  The  SARS-CoV-2 RNA is generally detectable in upper respiratory specimens during the acute phase of infection. The lowest concentration of SARS-CoV-2 viral copies this assay can detect is 138 copies/mL. A negative result does not preclude SARS-Cov-2 infection and should not be used as the sole basis for treatment or other patient management decisions. A negative result may occur with  improper specimen collection/handling, submission of specimen other than nasopharyngeal swab, presence of viral mutation(s) within the areas targeted by this assay, and inadequate number of viral copies(<138 copies/mL). A negative result must be combined with clinical observations, patient history, and epidemiological information. The expected result is Negative.  Fact Sheet for Patients:  bloggercourse.com  Fact Sheet for Healthcare Providers:  seriousbroker.it  This test is no t yet approved or cleared by the [place]  FDA and  has been authorized for detection and/or diagnosis  of SARS-CoV-2 by FDA under an Emergency Use Authorization (EUA). This EUA will remain  in effect (meaning this test can be used) for the duration of the COVID-19 declaration under Section 564(b)(1) of the Act, 21 U.S.C.section 360bbb-3(b)(1), unless the authorization is terminated  or revoked sooner.       Influenza A by PCR NEGATIVE NEGATIVE Final   Influenza B by PCR NEGATIVE NEGATIVE Final    Comment: (NOTE) The Xpert Xpress SARS-CoV-2/FLU/RSV plus assay is intended as an aid in the diagnosis of influenza from Nasopharyngeal swab specimens and should not be used as a sole basis for treatment. Nasal washings and aspirates are unacceptable for Xpert Xpress SARS-CoV-2/FLU/RSV testing.  Fact Sheet for Patients: bloggercourse.com  Fact Sheet for Healthcare Providers: seriousbroker.it  This test is not yet approved or cleared by the [place]  FDA and has been authorized for detection and/or diagnosis of SARS-CoV-2 by FDA under an Emergency Use Authorization (EUA). This EUA will remain in effect (meaning this test can be used) for the duration of the COVID-19 declaration under Section 564(b)(1) of the Act, 21 U.S.C. section 360bbb-3(b)(1), unless the authorization is terminated or revoked.     Resp Syncytial Virus by PCR NEGATIVE NEGATIVE Final    Comment: (NOTE) Fact Sheet for Patients: bloggercourse.com  Fact Sheet for Healthcare Providers: seriousbroker.it  This test is not yet approved or cleared by the [place]  FDA and has been authorized for detection and/or diagnosis of SARS-CoV-2 by FDA under an Emergency Use Authorization (EUA). This EUA will remain in effect (meaning this test can be used) for the duration of the COVID-19 declaration under Section 564(b)(1) of the Act, 21 U.S.C. section 360bbb-3(b)(1), unless the authorization is terminated  or revoked.  Performed at [facility], [address] [place], [place] [address]   MRSA Next Gen by PCR, Nasal     Status: Abnormal   Collection Time: 10/15/24  9:53 AM   Specimen: Nasal Mucosa; Nasal Swab  Result Value Ref Range Status   MRSA by PCR Next Gen DETECTED (A) NOT DETECTED Final    Comment: CRITICAL RESULT CALLED TO, READ BACK BY AND VERIFIED WITH: C. KELLAM AT 1220 ON 12.15.25 BY ADGER J         The GeneXpert MRSA Assay (FDA approved for NASAL specimens only), is one component of a comprehensive MRSA colonization surveillance program. It is not intended to diagnose MRSA infection nor to guide or monitor treatment for MRSA infections. Performed at [facility], [address], [place], [place] [address]   Respiratory (~20 pathogens) panel by PCR     Status: None   Collection Time: 10/15/24  9:53 AM   Specimen: Nasopharyngeal Swab; Respiratory  Result Value Ref Range Status   Adenovirus NOT DETECTED NOT DETECTED Final   Coronavirus 229E NOT DETECTED NOT DETECTED Final    Comment: (NOTE) The Coronavirus on the Respiratory Panel, DOES NOT test for the novel  Coronavirus (2019 nCoV)    Coronavirus HKU1 NOT DETECTED NOT DETECTED Final   Coronavirus NL63 NOT DETECTED NOT DETECTED Final   Coronavirus OC43 NOT DETECTED NOT DETECTED Final   Metapneumovirus NOT DETECTED NOT DETECTED Final   Rhinovirus / Enterovirus NOT DETECTED NOT DETECTED Final   Influenza A NOT DETECTED NOT DETECTED Final   Influenza B NOT DETECTED NOT DETECTED Final   Parainfluenza Virus 1 NOT DETECTED NOT DETECTED Final   Parainfluenza Virus 2 NOT DETECTED NOT DETECTED Final   Parainfluenza Virus 3 NOT DETECTED NOT DETECTED Final   Parainfluenza Virus 4 NOT DETECTED NOT DETECTED Final   Respiratory Syncytial Virus NOT DETECTED NOT DETECTED Final   Bordetella pertussis NOT DETECTED NOT DETECTED Final   Bordetella Parapertussis NOT DETECTED NOT DETECTED Final   Chlamydophila pneumoniae NOT  DETECTED NOT DETECTED Final   Mycoplasma pneumoniae NOT DETECTED NOT DETECTED Final    Comment: Performed at [facility], [address] [place], [place] [address]     Serology:    Imaging: If present, new imagings (plain films, ct scans, and mri) have been personally visualized and interpreted; radiology reports have been reviewed. Decision making incorporated into the Impression / Recommendations.  12/14 ct abd pelv and cta chest 1. No CT evidence for acute pulmonary embolus. 2. 13 mm short axis distal paraesophageal lymph node. This is nonspecific and may be reactive. Consider follow-up CT in 3 months to ensure stability. 3. 5 mm parahilar right lung nodule. This is almost assuredly benign given patient age. 4. No acute findings in the abdomen or pelvis.  Constance ONEIDA Passer, MD [facility] [phone] pager    10/16/2024, 1:19 PM     [1]  Social History Tobacco Use   Smoking status: Never    Passive exposure: Never   Smokeless tobacco: Former    Types: Snuff  Vaping Use   Vaping status: Never Used  Substance Use Topics   Alcohol use: No   Drug use: No  [2]  Allergies Allergen Reactions   Bee Venom Anaphylaxis

## 2024-10-16 NOTE — Progress Notes (Signed)
 PROGRESS NOTE    Mitchell Cochran  FMW:[id number] DOB: 1996-05-01 DOA: 10/13/2024 PCP: [name], [name]   Brief Narrative:    Mitchell Cochran is a 28 y.o. male with medical history significant of GERD who presents emergency department with onset of fever on 11/26, this resolved after about 4 days, fever returned after about 2 days and it was usually in the evening and was associated with diaphoresis.  On Sunday (12/7) he went to an urgent care and was started on doxycycline, he went to his PCP on Thursday (12/11) and was started on Z-Pak but continued to have fever with maximum temperature being 103F.  He has been taking Tylenol  and ibuprofen as needed for the fever.  He denies any sick contacts at home or at work and denies any tick bite, recent travel or exposure to wild animals.  Patient also denies chest pain, shortness of breath, nausea, vomiting, diarrhea.  Patient was empirically started on broad-spectrum IV antibiotics and there has been no growth on blood cultures to date.  Discussed case with ID, Dr. Overton who recommends transfer to [facility] for further evaluation at bedside.  Other lab work ordered as noted below.  Patient is still awaiting transfer to [facility] for further ID evaluation, but has not had any further fever overnight.  Assessment & Plan:   Principal Problem:   Fever  Assessment and Plan:   Fever of unknown origin Procalcitonin 0.6 Chest x-ray negative Respiratory panel virus negative, full respiratory panel pending CT studies unremarkable except for some reactive lymphadenopathy Parvovirus, EBV, CMV, LDH studies ordered as well as ESR, CRP, rheumatoid factor, and lupus workup ESR and CRP elevated HIV and hepatitis panel negative -Antibiotics discontinued 12/15   Thrombocytopenia-worsening Appreciate ID workup and may require bone marrow biopsy for further evaluation Continue monitor labs Likely will require hematology evaluation at [facility]    Transaminitis-improving Hepatitis panel negative   Obesity class I (BMI 31.19) Diet and lifestyle modification    DVT prophylaxis: SCDs Code Status: Full Family Communication: Spouse at bedside 12/16 Disposition Plan: Transfer to [facility] for further evaluation per ID as well as likely hematology evaluation Status is: Inpatient Remains inpatient appropriate because: Need for further inpatient workup.   Consultants:  ID, discussed case with Dr. Overton  Procedures:  None  Antimicrobials:  Anti-infectives (From admission, onward)    Start     Dose/Rate Route Frequency Ordered Stop   10/14/24 1800  vancomycin  (VANCOREADY) IVPB 2000 mg/400 mL  Status:  Discontinued        2,000 mg 200 mL/hr over 120 Minutes Intravenous Every 12 hours 10/14/24 0325 10/15/24 1431   10/14/24 0330  piperacillin -tazobactam (ZOSYN ) IVPB 3.375 g  Status:  Discontinued        3.375 g 12.5 mL/hr over 240 Minutes Intravenous Every 8 hours 10/14/24 0317 10/15/24 1431   10/14/24 0315  vancomycin  (VANCOREADY) IVPB 2000 mg/400 mL        2,000 mg 200 mL/hr over 120 Minutes Intravenous  Once 10/14/24 0314 10/14/24 0551   10/13/24 2230  cefTRIAXone  (ROCEPHIN ) 1 g in sodium chloride  0.9 % 100 mL IVPB        1 g 200 mL/hr over 30 Minutes Intravenous  Once 10/13/24 2227 10/13/24 2321       Subjective: Patient seen and evaluated today with no new acute complaints or concerns. No acute concerns or events noted overnight.  He was noted to have some night sweats, but no further fevers  yesterday.  Objective: Vitals:   10/15/24 1918 10/15/24 2206 10/16/24 0045 10/16/24 0404  BP: 135/78   117/76  Pulse: 94   81  Resp: 18   18  Temp: 98.7 F (37.1 C) 99.4 F (37.4 C) 98.7 F (37.1 C) 98.4 F (36.9 C)  TempSrc: Oral   Oral  SpO2: 99%   98%  Weight:      Height:       No intake or output data in the 24 hours ending 10/16/24 0903  Filed Weights   10/13/24 2242 10/14/24 1050  Weight: 104.3 kg 105.6 kg     Examination:  General exam: Appears calm and comfortable  Respiratory system: Clear to auscultation. Respiratory effort normal. Cardiovascular system: S1 & S2 heard, RRR.  Gastrointestinal system: Abdomen is soft Central nervous system: Alert and awake Extremities: No edema Skin: No significant lesions noted Psychiatry: Flat affect.    Data Reviewed: I have personally reviewed following labs and imaging studies  CBC: Recent Labs  Lab 10/13/24 2223 10/14/24 0553 10/15/24 0432 10/16/24 0505  WBC 4.6 4.6 3.7* 4.0  NEUTROABS 0.8*  --  0.8*  --   HGB 13.5 12.6* 12.2* 13.4  HCT 39.2 37.1* 36.5* 41.3  MCV 88.5 90.0 90.6 94.5  PLT 77* 73* 65* 40*   Basic Metabolic Panel: Recent Labs  Lab 10/13/24 2223 10/14/24 0553 10/15/24 0432 10/16/24 0505  NA 139 139 140 139  K 3.5 3.8 3.9 3.9  CL 104 105 106 106  CO2 26 24 26 22   GLUCOSE 122* 87 98 91  BUN 15 13 11 11   CREATININE 0.94 0.85 0.94 0.86  CALCIUM 8.8* 8.6* 8.6* 8.9  MG  --  2.1  --  2.5*  PHOS  --  2.6  --   --    GFR: Estimated Creatinine Clearance: 157.8 mL/min (by C-G formula based on SCr of 0.86 mg/dL). Liver Function Tests: Recent Labs  Lab 10/13/24 2223 10/14/24 0553 10/15/24 0432 10/16/24 0505  AST 67* 64* 64* 47*  ALT 76* 72* 87* 74*  ALKPHOS 109 98 100 101  BILITOT 0.9 0.8 0.6 0.6  PROT 6.7 6.5 6.3* 6.9  ALBUMIN 3.8 3.7 3.6 3.7   No results for input(s): LIPASE, AMYLASE in the last 168 hours. No results for input(s): AMMONIA in the last 168 hours. Coagulation Profile: No results for input(s): INR, PROTIME in the last 168 hours. Cardiac Enzymes: No results for input(s): CKTOTAL, CKMB, CKMBINDEX, TROPONINI in the last 168 hours. BNP (last 3 results) No results for input(s): PROBNP in the last 8760 hours. HbA1C: No results for input(s): HGBA1C in the last 72 hours. CBG: No results for input(s): GLUCAP in the last 168 hours. Lipid Profile: No results for input(s):  CHOL, HDL, LDLCALC, TRIG, CHOLHDL, LDLDIRECT in the last 72 hours. Thyroid Function Tests: No results for input(s): TSH, T4TOTAL, FREET4, T3FREE, THYROIDAB in the last 72 hours. Anemia Panel: No results for input(s): VITAMINB12, FOLATE, FERRITIN, TIBC, IRON, RETICCTPCT in the last 72 hours. Sepsis Labs: Recent Labs  Lab 10/13/24 2223 10/14/24 0553  PROCALCITON  --  0.63  LATICACIDVEN 0.9  --     Recent Results (from the past 240 hours)  Blood Culture (routine x 2)     Status: None (Preliminary result)   Collection Time: 10/13/24 10:40 PM   Specimen: BLOOD RIGHT ARM  Result Value Ref Range Status   Specimen Description BLOOD RIGHT ARM  Final   Special Requests   Final  BOTTLES DRAWN AEROBIC AND ANAEROBIC Blood Culture adequate volume   Culture   Final    NO GROWTH 3 DAYS Performed at [facility], [address], [place], [place] [address]    Report Status PENDING  Incomplete  Blood Culture (routine x 2)     Status: None (Preliminary result)   Collection Time: 10/13/24 10:50 PM   Specimen: BLOOD  Result Value Ref Range Status   Specimen Description   Final    BLOOD LEFT ANTECUBITAL Performed at [facility], [address] [place], [place] [address]    Special Requests   Final    BOTTLES DRAWN AEROBIC AND ANAEROBIC Blood Culture adequate volume   Culture   Final    NO GROWTH 3 DAYS Performed at [facility], [address] [place], [place] [address]    Report Status PENDING  Incomplete  Resp panel by RT-PCR (RSV, Flu A&B, Covid) Anterior Nasal Swab     Status: None   Collection Time: 10/14/24  3:37 AM   Specimen: Anterior Nasal Swab  Result Value Ref Range Status   SARS Coronavirus 2 by RT PCR NEGATIVE NEGATIVE Final    Comment: (NOTE) SARS-CoV-2 target nucleic acids are NOT DETECTED.  The SARS-CoV-2 RNA is generally detectable in upper respiratory specimens during the acute phase of infection. The lowest concentration of  SARS-CoV-2 viral copies this assay can detect is 138 copies/mL. A negative result does not preclude SARS-Cov-2 infection and should not be used as the sole basis for treatment or other patient management decisions. A negative result may occur with  improper specimen collection/handling, submission of specimen other than nasopharyngeal swab, presence of viral mutation(s) within the areas targeted by this assay, and inadequate number of viral copies(<138 copies/mL). A negative result must be combined with clinical observations, patient history, and epidemiological information. The expected result is Negative.  Fact Sheet for Patients:  bloggercourse.com  Fact Sheet for Healthcare Providers:  seriousbroker.it  This test is no t yet approved or cleared by the [place]  FDA and  has been authorized for detection and/or diagnosis of SARS-CoV-2 by FDA under an Emergency Use Authorization (EUA). This EUA will remain  in effect (meaning this test can be used) for the duration of the COVID-19 declaration under Section 564(b)(1) of the Act, 21 U.S.C.section 360bbb-3(b)(1), unless the authorization is terminated  or revoked sooner.       Influenza A by PCR NEGATIVE NEGATIVE Final   Influenza B by PCR NEGATIVE NEGATIVE Final    Comment: (NOTE) The Xpert Xpress SARS-CoV-2/FLU/RSV plus assay is intended as an aid in the diagnosis of influenza from Nasopharyngeal swab specimens and should not be used as a sole basis for treatment. Nasal washings and aspirates are unacceptable for Xpert Xpress SARS-CoV-2/FLU/RSV testing.  Fact Sheet for Patients: bloggercourse.com  Fact Sheet for Healthcare Providers: seriousbroker.it  This test is not yet approved or cleared by the [place]  FDA and has been authorized for detection and/or diagnosis of SARS-CoV-2 by FDA under an Emergency Use  Authorization (EUA). This EUA will remain in effect (meaning this test can be used) for the duration of the COVID-19 declaration under Section 564(b)(1) of the Act, 21 U.S.C. section 360bbb-3(b)(1), unless the authorization is terminated or revoked.     Resp Syncytial Virus by PCR NEGATIVE NEGATIVE Final    Comment: (NOTE) Fact Sheet for Patients: bloggercourse.com  Fact Sheet for Healthcare Providers: seriousbroker.it  This test is not yet approved or cleared by the [place]  [place] FDA and has been authorized for detection and/or diagnosis of SARS-CoV-2 by FDA under an Emergency Use Authorization (EUA). This EUA will remain in effect (meaning this test can be used) for the duration of the COVID-19 declaration under Section 564(b)(1) of the Act, 21 U.S.C. section 360bbb-3(b)(1), unless the authorization is terminated or revoked.  Performed at [facility], [address] [place], [place] [address]   MRSA Next Gen by PCR, Nasal     Status: Abnormal   Collection Time: 10/15/24  9:53 AM   Specimen: Nasal Mucosa; Nasal Swab  Result Value Ref Range Status   MRSA by PCR Next Gen DETECTED (A) NOT DETECTED Final    Comment: CRITICAL RESULT CALLED TO, READ BACK BY AND VERIFIED WITH: C. KELLAM AT 1220 ON 12.15.25 BY ADGER J         The GeneXpert MRSA Assay (FDA approved for NASAL specimens only), is one component of a comprehensive MRSA colonization surveillance program. It is not intended to diagnose MRSA infection nor to guide or monitor treatment for MRSA infections. Performed at [facility], [address], [place], [place] [address]   Respiratory (~20 pathogens) panel by PCR     Status: None   Collection Time: 10/15/24  9:53 AM   Specimen: Nasopharyngeal Swab; Respiratory  Result Value Ref Range Status   Adenovirus NOT DETECTED NOT DETECTED Final   Coronavirus 229E NOT DETECTED NOT DETECTED Final    Comment: (NOTE) The  Coronavirus on the Respiratory Panel, DOES NOT test for the novel  Coronavirus (2019 nCoV)    Coronavirus HKU1 NOT DETECTED NOT DETECTED Final   Coronavirus NL63 NOT DETECTED NOT DETECTED Final   Coronavirus OC43 NOT DETECTED NOT DETECTED Final   Metapneumovirus NOT DETECTED NOT DETECTED Final   Rhinovirus / Enterovirus NOT DETECTED NOT DETECTED Final   Influenza A NOT DETECTED NOT DETECTED Final   Influenza B NOT DETECTED NOT DETECTED Final   Parainfluenza Virus 1 NOT DETECTED NOT DETECTED Final   Parainfluenza Virus 2 NOT DETECTED NOT DETECTED Final   Parainfluenza Virus 3 NOT DETECTED NOT DETECTED Final   Parainfluenza Virus 4 NOT DETECTED NOT DETECTED Final   Respiratory Syncytial Virus NOT DETECTED NOT DETECTED Final   Bordetella pertussis NOT DETECTED NOT DETECTED Final   Bordetella Parapertussis NOT DETECTED NOT DETECTED Final   Chlamydophila pneumoniae NOT DETECTED NOT DETECTED Final   Mycoplasma pneumoniae NOT DETECTED NOT DETECTED Final    Comment: Performed at [facility], [address] [place], [place] [address]         Radiology Studies: CT Angio Chest Pulmonary Embolism (PE) W or WO Contrast Result Date: 10/14/2024 CLINICAL DATA:  Shortness of breath and tachycardia.  Sepsis. EXAM: CT ANGIOGRAPHY CHEST CT ABDOMEN AND PELVIS WITH CONTRAST TECHNIQUE: Multidetector CT imaging of the chest was performed using the standard protocol during bolus administration of intravenous contrast. Multiplanar CT image reconstructions and MIPs were obtained to evaluate the vascular anatomy. Multidetector CT imaging of the abdomen and pelvis was performed using the standard protocol during bolus administration of intravenous contrast. RADIATION DOSE REDUCTION: This exam was performed according to the departmental dose-optimization program which includes automated exposure control, adjustment of the mA and/or kV according to patient size and/or use of iterative reconstruction technique.  CONTRAST:  OMNIPAQUE  IOHEXOL  350 MG/ML SOLN COMPARISON:  None Available. FINDINGS: CTA CHEST FINDINGS Cardiovascular: The heart size is normal. No substantial pericardial effusion. No thoracic aortic aneurysm. No substantial atherosclerotic calcification of the  thoracic aorta. There is no filling defect within the opacified pulmonary arteries to suggest the presence of an acute pulmonary embolus. Mediastinum/Nodes: No upper mediastinal lymphadenopathy. 13 mm short axis distal paraesophageal lymph node evident there is no hilar lymphadenopathy. The esophagus has normal imaging features. There is no axillary lymphadenopathy. Lungs/Pleura: 5 mm parahilar right lung nodule identified on image 69/10. No other suspicious pulmonary nodule or mass. Dependent atelectasis noted in the lower lobes bilaterally. No dense focal airspace consolidation. No pulmonary edema or pleural effusion. Review of the MIP images confirms the above findings. CT ABDOMEN and PELVIS FINDINGS Hepatobiliary: No suspicious focal abnormality within the liver parenchyma. There is no evidence for gallstones, gallbladder wall thickening, or pericholecystic fluid. No intrahepatic or extrahepatic biliary dilation. Pancreas: No focal mass lesion. No dilatation of the main duct. No intraparenchymal cyst. No peripancreatic edema. Spleen: No splenomegaly. No suspicious focal mass lesion. Adrenals/Urinary Tract: No adrenal nodule or mass. 2 mm nonobstructing stone noted upper pole left kidney (coronal 120/6). Right kidney unremarkable. No evidence for hydroureter. The urinary bladder appears normal for the degree of distention. Stomach/Bowel: Stomach is unremarkable. No gastric wall thickening. No evidence of outlet obstruction. Duodenum is normally positioned as is the ligament of Treitz. No small bowel wall thickening. No small bowel dilatation. The terminal ileum is normal. The appendix is not well visualized, but there is no edema or inflammation in  the region of the cecal tip to suggest appendicitis. No gross colonic mass. No colonic wall thickening. Vascular/Lymphatic: No abdominal aortic aneurysm. No abdominal aortic atherosclerotic calcification. Upper normal hepatoduodenal ligament lymph nodes. No retroperitoneal lymphadenopathy. No pelvic sidewall lymphadenopathy. Reproductive: The prostate gland and seminal vesicles are unremarkable. Other: No intraperitoneal free fluid. Musculoskeletal: No worrisome lytic or sclerotic osseous abnormality. Review of the MIP images confirms the above findings. IMPRESSION: 1. No CT evidence for acute pulmonary embolus. 2. 13 mm short axis distal paraesophageal lymph node. This is nonspecific and may be reactive. Consider follow-up CT in 3 months to ensure stability. 3. 5 mm parahilar right lung nodule. This is almost assuredly benign given patient age. 4. No acute findings in the abdomen or pelvis. Electronically Signed   By: Camellia Candle M.D.   On: 10/14/2024 13:17   CT ABDOMEN PELVIS W CONTRAST Result Date: 10/14/2024 CLINICAL DATA:  Shortness of breath and tachycardia.  Sepsis. EXAM: CT ANGIOGRAPHY CHEST CT ABDOMEN AND PELVIS WITH CONTRAST TECHNIQUE: Multidetector CT imaging of the chest was performed using the standard protocol during bolus administration of intravenous contrast. Multiplanar CT image reconstructions and MIPs were obtained to evaluate the vascular anatomy. Multidetector CT imaging of the abdomen and pelvis was performed using the standard protocol during bolus administration of intravenous contrast. RADIATION DOSE REDUCTION: This exam was performed according to the departmental dose-optimization program which includes automated exposure control, adjustment of the mA and/or kV according to patient size and/or use of iterative reconstruction technique. CONTRAST:  OMNIPAQUE  IOHEXOL  350 MG/ML SOLN COMPARISON:  None Available. FINDINGS: CTA CHEST FINDINGS Cardiovascular: The heart size is normal.  No substantial pericardial effusion. No thoracic aortic aneurysm. No substantial atherosclerotic calcification of the thoracic aorta. There is no filling defect within the opacified pulmonary arteries to suggest the presence of an acute pulmonary embolus. Mediastinum/Nodes: No upper mediastinal lymphadenopathy. 13 mm short axis distal paraesophageal lymph node evident there is no hilar lymphadenopathy. The esophagus has normal imaging features. There is no axillary lymphadenopathy. Lungs/Pleura: 5 mm parahilar right lung nodule identified on image 69/10. No  other suspicious pulmonary nodule or mass. Dependent atelectasis noted in the lower lobes bilaterally. No dense focal airspace consolidation. No pulmonary edema or pleural effusion. Review of the MIP images confirms the above findings. CT ABDOMEN and PELVIS FINDINGS Hepatobiliary: No suspicious focal abnormality within the liver parenchyma. There is no evidence for gallstones, gallbladder wall thickening, or pericholecystic fluid. No intrahepatic or extrahepatic biliary dilation. Pancreas: No focal mass lesion. No dilatation of the main duct. No intraparenchymal cyst. No peripancreatic edema. Spleen: No splenomegaly. No suspicious focal mass lesion. Adrenals/Urinary Tract: No adrenal nodule or mass. 2 mm nonobstructing stone noted upper pole left kidney (coronal 120/6). Right kidney unremarkable. No evidence for hydroureter. The urinary bladder appears normal for the degree of distention. Stomach/Bowel: Stomach is unremarkable. No gastric wall thickening. No evidence of outlet obstruction. Duodenum is normally positioned as is the ligament of Treitz. No small bowel wall thickening. No small bowel dilatation. The terminal ileum is normal. The appendix is not well visualized, but there is no edema or inflammation in the region of the cecal tip to suggest appendicitis. No gross colonic mass. No colonic wall thickening. Vascular/Lymphatic: No abdominal aortic  aneurysm. No abdominal aortic atherosclerotic calcification. Upper normal hepatoduodenal ligament lymph nodes. No retroperitoneal lymphadenopathy. No pelvic sidewall lymphadenopathy. Reproductive: The prostate gland and seminal vesicles are unremarkable. Other: No intraperitoneal free fluid. Musculoskeletal: No worrisome lytic or sclerotic osseous abnormality. Review of the MIP images confirms the above findings. IMPRESSION: 1. No CT evidence for acute pulmonary embolus. 2. 13 mm short axis distal paraesophageal lymph node. This is nonspecific and may be reactive. Consider follow-up CT in 3 months to ensure stability. 3. 5 mm parahilar right lung nodule. This is almost assuredly benign given patient age. 4. No acute findings in the abdomen or pelvis. Electronically Signed   By: Camellia Candle M.D.   On: 10/14/2024 13:17      LOS: 2 days    Time spent: 55 minutes    Lauri Till D Maree, DO [facility]  If 7PM-7AM, please contact night-coverage [email or web address] 10/16/2024, 9:03 AM

## 2024-10-16 NOTE — Consult Note (Signed)
 [facility] Consultation Hematology/Oncology  CONSULTING PHYSICIAN:  Dr.Pratik Maree, DO  REASON FOR CONSULT:  Thrombocytopenia    HISTORY OF PRESENT ILLNESS:   Mitchell Cochran is a 28 y.o. male with past medical history of GERD presented to the ED with unresolving fever since 11/26.  Patient reported post fever onset the day after Thanksgiving and had a couple days where it resolved.  He was seen at urgent care during this time and was started on doxycycline which he took for 3 days and he also took azithromycin  for 2-1/2 days.  His temperatures ranged from 101 to 103 during this time measured with a thermometer.  He reported also having chills during these episodes and sweats after that.  He denies abdominal pain, nausea, vomiting, shortness of breath, chest pain, diarrhea during this. He does not have any rashes at this time.  [place] prior to this starting has not had any recent travel or hiked.  He intentionally lost 20 pounds over the past few months with effort.  His appetite is good.  Denies night sweats.      MEDICATIONS: Prior to Admission:  Medications Prior to Admission  Medication Sig Dispense Refill Last Dose/Taking   acetaminophen  (TYLENOL ) 500 MG tablet Take 1,000 mg by mouth every 6 (six) hours as needed for mild pain (pain score 1-3).   10/13/2024 Bedtime   albuterol  (VENTOLIN  HFA) 108 (90 Base) MCG/ACT inhaler Inhale 2 puffs into the lungs every 4 (four) hours as needed. (Patient taking differently: Inhale 2 puffs into the lungs every 4 (four) hours as needed for wheezing or shortness of breath.) 18 g 0 Unknown   azithromycin  (ZITHROMAX ) 250 MG tablet Take 2 tablets on day 1, then 1 tablet daily on days 2 through 5 (Patient taking differently: Take 250 mg by mouth daily. Take 2 tablets on day 1, then 1 tablet daily on days 2 through 5) 6 tablet 0 10/13/2024 Morning   benzonatate  (TESSALON ) 200 MG capsule Take 1 capsule (200 mg total) by  mouth 2 (two) times daily as needed for cough. 20 capsule 0 10/13/2024 Evening   EPINEPHrine  0.3 mg/0.3 mL IJ SOAJ injection Inject 0.3 mg into the muscle as needed for anaphylaxis. 1 each 0 Unknown   ibuprofen (ADVIL) 800 MG tablet Take 800 mg by mouth every 6 (six) hours as needed for mild pain (pain score 1-3).   10/13/2024 Bedtime   omeprazole  (PRILOSEC) 40 MG capsule Take 1 capsule (40 mg total) by mouth in the morning and at bedtime. 60 capsule 1 10/13/2024 Morning   doxycycline (VIBRA-TABS) 100 MG tablet Take 100 mg by mouth 2 (two) times daily. (Patient not taking: Reported on 10/14/2024)   Not Taking   predniSONE  (DELTASONE ) 10 MG tablet Take 2 tablets (20 mg total) by mouth daily. (Patient not taking: Reported on 10/14/2024) 15 tablet 0 Not Taking   promethazine -dextromethorphan (PROMETHAZINE -DM) 6.25-15 MG/5ML syrup Take 5 mLs by mouth 4 (four) times daily as needed. (Patient not taking: Reported on 10/14/2024) 100 mL 0 Not Taking         PERFORMANCE STATUS: The patient's performance status is 0 - Asymptomatic  PHYSICAL EXAM: Most Recent Vital Signs: Blood pressure (!) 134/103, pulse 92, temperature 97.8 F (36.6 C), temperature source Oral, resp. rate 12, height 6' (1.829 m), weight 270 lb (122.5 kg), SpO2 91%.  GENERAL:alert, no distress and comfortable SKIN: skin color, texture, turgor are normal, no rashes or significant lesions NECK: supple, thyroid  normal size, non-tender, without nodularity LYMPH:  no palpable lymphadenopathy in the cervical, axillary or inguinal LUNGS: clear to auscultation and percussion with normal breathing effort HEART: regular rate & rhythm and no murmurs and no lower extremity edema ABDOMEN:abdomen soft, non-tender and normal bowel sounds Musculoskeletal:no cyanosis of digits and no clubbing  PSYCH: alert & oriented x 3 with fluent speech NEURO: no focal motor/sensory deficits    LABORATORY DATA:   Last CBC Lab Results  Component Value Date    WBC 4.0 10/16/2024   HGB 13.4 10/16/2024   HCT 41.3 10/16/2024   MCV 94.5 10/16/2024   MCH 30.7 10/16/2024   RDW 12.8 10/16/2024   PLT 40 (L) 10/16/2024     Last metabolic panel Lab Results  Component Value Date   GLUCOSE 91 10/16/2024   NA 139 10/16/2024   K 3.9 10/16/2024   CL 106 10/16/2024   CO2 22 10/16/2024   BUN 11 10/16/2024   CREATININE 0.86 10/16/2024   GFRNONAA >60 10/16/2024   CALCIUM 8.9 10/16/2024   PHOS 2.6 10/14/2024   PROT 6.9 10/16/2024   ALBUMIN 3.7 10/16/2024   LABGLOB 2.5 03/19/2024   BILITOT 0.6 10/16/2024   ALKPHOS 101 10/16/2024   AST 47 (H) 10/16/2024   ALT 74 (H) 10/16/2024   ANIONGAP 11 10/16/2024      RADIOGRAPHY:  CT ABDOMEN PELVIS W CONTRAST CLINICAL DATA:  Shortness of breath and tachycardia.  Sepsis.  EXAM: CT ANGIOGRAPHY CHEST  CT ABDOMEN AND PELVIS WITH CONTRAST  TECHNIQUE: Multidetector CT imaging of the chest was performed using the standard protocol during bolus administration of intravenous contrast. Multiplanar CT image reconstructions and MIPs were obtained to evaluate the vascular anatomy. Multidetector CT imaging of the abdomen and pelvis was performed using the standard protocol during bolus administration of intravenous contrast.  RADIATION DOSE REDUCTION: This exam was performed according to the departmental dose-optimization program which includes automated exposure control, adjustment of the mA and/or kV according to patient size and/or use of iterative reconstruction technique.  CONTRAST:  OMNIPAQUE  IOHEXOL  350 MG/ML SOLN  COMPARISON:  None Available.  FINDINGS: CTA CHEST FINDINGS  Cardiovascular: The heart size is normal. No substantial pericardial effusion. No thoracic aortic aneurysm. No substantial atherosclerotic calcification of the thoracic aorta. There is no filling defect within the opacified pulmonary arteries to suggest the presence of an acute pulmonary  embolus.  Mediastinum/Nodes: No upper mediastinal lymphadenopathy. 13 mm short axis distal paraesophageal lymph node evident there is no hilar lymphadenopathy. The esophagus has normal imaging features. There is no axillary lymphadenopathy.  Lungs/Pleura: 5 mm parahilar right lung nodule identified on image 69/10. No other suspicious pulmonary nodule or mass. Dependent atelectasis noted in the lower lobes bilaterally. No dense focal airspace consolidation. No pulmonary edema or pleural effusion.  Review of the MIP images confirms the above findings.  CT ABDOMEN and PELVIS FINDINGS  Hepatobiliary: No suspicious focal abnormality within the liver parenchyma. There is no evidence for gallstones, gallbladder wall thickening, or pericholecystic fluid. No intrahepatic or extrahepatic biliary dilation.  Pancreas: No focal mass lesion. No dilatation of the main duct. No intraparenchymal cyst. No peripancreatic edema.  Spleen: No splenomegaly. No suspicious focal mass lesion.  Adrenals/Urinary Tract: No adrenal nodule or mass. 2 mm nonobstructing stone noted upper pole left kidney (coronal 120/6). Right kidney unremarkable. No evidence for hydroureter. The urinary bladder appears normal for the degree of distention.  Stomach/Bowel: Stomach is unremarkable. No gastric wall thickening. No evidence of outlet obstruction. Duodenum  is normally positioned as is the ligament of Treitz. No small bowel wall thickening. No small bowel dilatation. The terminal ileum is normal. The appendix is not well visualized, but there is no edema or inflammation in the region of the cecal tip to suggest appendicitis. No gross colonic mass. No colonic wall thickening.  Vascular/Lymphatic: No abdominal aortic aneurysm. No abdominal aortic atherosclerotic calcification. Upper normal hepatoduodenal ligament lymph nodes. No retroperitoneal lymphadenopathy. No pelvic sidewall lymphadenopathy.  Reproductive:  The prostate gland and seminal vesicles are unremarkable.  Other: No intraperitoneal free fluid.  Musculoskeletal: No worrisome lytic or sclerotic osseous abnormality.  Review of the MIP images confirms the above findings.  IMPRESSION: 1. No CT evidence for acute pulmonary embolus. 2. 13 mm short axis distal paraesophageal lymph node. This is nonspecific and may be reactive. Consider follow-up CT in 3 months to ensure stability. 3. 5 mm parahilar right lung nodule. This is almost assuredly benign given patient age. 4. No acute findings in the abdomen or pelvis.  Electronically Signed   By: Camellia Candle M.D.   On: 10/14/2024 13:17 CT Angio Chest Pulmonary Embolism (PE) W or WO Contrast CLINICAL DATA:  Shortness of breath and tachycardia.  Sepsis.  EXAM: CT ANGIOGRAPHY CHEST  CT ABDOMEN AND PELVIS WITH CONTRAST  TECHNIQUE: Multidetector CT imaging of the chest was performed using the standard protocol during bolus administration of intravenous contrast. Multiplanar CT image reconstructions and MIPs were obtained to evaluate the vascular anatomy. Multidetector CT imaging of the abdomen and pelvis was performed using the standard protocol during bolus administration of intravenous contrast.  RADIATION DOSE REDUCTION: This exam was performed according to the departmental dose-optimization program which includes automated exposure control, adjustment of the mA and/or kV according to patient size and/or use of iterative reconstruction technique.  CONTRAST:  OMNIPAQUE  IOHEXOL  350 MG/ML SOLN  COMPARISON:  None Available.  FINDINGS: CTA CHEST FINDINGS  Cardiovascular: The heart size is normal. No substantial pericardial effusion. No thoracic aortic aneurysm. No substantial atherosclerotic calcification of the thoracic aorta. There is no filling defect within the opacified pulmonary arteries to suggest the presence of an acute pulmonary embolus.  Mediastinum/Nodes:  No upper mediastinal lymphadenopathy. 13 mm short axis distal paraesophageal lymph node evident there is no hilar lymphadenopathy. The esophagus has normal imaging features. There is no axillary lymphadenopathy.  Lungs/Pleura: 5 mm parahilar right lung nodule identified on image 69/10. No other suspicious pulmonary nodule or mass. Dependent atelectasis noted in the lower lobes bilaterally. No dense focal airspace consolidation. No pulmonary edema or pleural effusion.  Review of the MIP images confirms the above findings.  CT ABDOMEN and PELVIS FINDINGS  Hepatobiliary: No suspicious focal abnormality within the liver parenchyma. There is no evidence for gallstones, gallbladder wall thickening, or pericholecystic fluid. No intrahepatic or extrahepatic biliary dilation.  Pancreas: No focal mass lesion. No dilatation of the main duct. No intraparenchymal cyst. No peripancreatic edema.  Spleen: No splenomegaly. No suspicious focal mass lesion.  Adrenals/Urinary Tract: No adrenal nodule or mass. 2 mm nonobstructing stone noted upper pole left kidney (coronal 120/6). Right kidney unremarkable. No evidence for hydroureter. The urinary bladder appears normal for the degree of distention.  Stomach/Bowel: Stomach is unremarkable. No gastric wall thickening. No evidence of outlet obstruction. Duodenum is normally positioned as is the ligament of Treitz. No small bowel wall thickening. No small bowel dilatation. The terminal ileum is normal. The appendix is not well visualized, but there is no edema or inflammation in the region  of the cecal tip to suggest appendicitis. No gross colonic mass. No colonic wall thickening.  Vascular/Lymphatic: No abdominal aortic aneurysm. No abdominal aortic atherosclerotic calcification. Upper normal hepatoduodenal ligament lymph nodes. No retroperitoneal lymphadenopathy. No pelvic sidewall lymphadenopathy.  Reproductive: The prostate gland and seminal  vesicles are unremarkable.  Other: No intraperitoneal free fluid.  Musculoskeletal: No worrisome lytic or sclerotic osseous abnormality.  Review of the MIP images confirms the above findings.  IMPRESSION: 1. No CT evidence for acute pulmonary embolus. 2. 13 mm short axis distal paraesophageal lymph node. This is nonspecific and may be reactive. Consider follow-up CT in 3 months to ensure stability. 3. 5 mm parahilar right lung nodule. This is almost assuredly benign given patient age. 4. No acute findings in the abdomen or pelvis.  Electronically Signed   By: Camellia Candle M.D.   On: 10/14/2024 13:17      ASSESSMENT:  Patient is a 28 year old male with past medical history of GERD admitted to the hospital for fever of unknown origin.  Hematology consulted for thrombocytopenia and neutropenia.  PLAN:   1.  Thrombocytopenia: Likely secondary to acute illness, likely from antibiotic use. Patient has no history of alcohol use. CT abdomen and pelvis did not reveal hepatosplenomegaly Peripheral smear reviewed: Normal-looking RBCs, increased lymphocytes and normal-looking neutrophils, no blasts.  Platelets are decreased in number, occasional slightly bigger platelets present but no giant platelets seen.  1 platelet clump identified No petechia or purpura at this time. HIV and hepatitis panel negative  -Will obtain nutritional workup for thrombocytopenia with a.m. labs. - Continue to monitor at this time - Likely related to acute illness and I expect this to improve with improvement in symptoms. - Will make outpatient lab check and follow-up with me to see if this improves after discharge. - No indication for platelet transfusion at this time - Transfuse for platelets less than 10 or less than 30 with bleeding - If platelets do not improve, will consider bone marrow biopsy  2.  Neutropenia: Improved white blood cell count today but no differential available at this time  -  Repeat CBC with differential in the morning. - Will obtain a flow cytometry with a.m. labs  3.  Fever of unknown origin: Blood cultures showed no growth at this time (3 days negative) HIV, hepatitis panel negative.  Respiratory viral panel negative.  EBV, parvovirus pending at this time.  Patient feels significantly improved since admission.  - Will obtain flow cytometry to rule out lymphoma considering patient has a paraesophageal lymph node that is enlarged.  Less likely on the differential though.    Thank you for involving us  in this patient's care.  Please to reach out with any questions or concerns.  Mickiel Dry, MD Hematology/Oncology [facility]

## 2024-10-16 NOTE — Plan of Care (Signed)

## 2024-10-17 ENCOUNTER — Other Ambulatory Visit: Payer: Self-pay | Admitting: *Deleted

## 2024-10-17 DIAGNOSIS — R509 Fever, unspecified: Secondary | ICD-10-CM | POA: Diagnosis not present

## 2024-10-17 DIAGNOSIS — D72819 Decreased white blood cell count, unspecified: Secondary | ICD-10-CM | POA: Diagnosis not present

## 2024-10-17 DIAGNOSIS — R6521 Severe sepsis with septic shock: Secondary | ICD-10-CM | POA: Diagnosis not present

## 2024-10-17 DIAGNOSIS — D61818 Other pancytopenia: Secondary | ICD-10-CM | POA: Diagnosis not present

## 2024-10-17 DIAGNOSIS — D696 Thrombocytopenia, unspecified: Secondary | ICD-10-CM

## 2024-10-17 DIAGNOSIS — R7401 Elevation of levels of liver transaminase levels: Secondary | ICD-10-CM | POA: Diagnosis not present

## 2024-10-17 LAB — COMPREHENSIVE METABOLIC PANEL WITH GFR
ALT: 83 U/L — ABNORMAL HIGH (ref 0–44)
AST: 45 U/L — ABNORMAL HIGH (ref 15–41)
Albumin: 3.8 g/dL (ref 3.5–5.0)
Alkaline Phosphatase: 96 U/L (ref 38–126)
Anion gap: 8 (ref 5–15)
BUN: 15 mg/dL (ref 6–20)
CO2: 27 mmol/L (ref 22–32)
Calcium: 9.1 mg/dL (ref 8.9–10.3)
Chloride: 107 mmol/L (ref 98–111)
Creatinine, Ser: 0.97 mg/dL (ref 0.61–1.24)
GFR, Estimated: >60 mL/min (ref >60)
Glucose, Bld: 104 mg/dL — ABNORMAL HIGH (ref 70–99)
Potassium: 4.4 mmol/L (ref 3.5–5.1)
Sodium: 142 mmol/L (ref 135–145)
Total Bilirubin: 0.5 mg/dL (ref 0.0–1.2)
Total Protein: 7.2 g/dL (ref 6.5–8.1)

## 2024-10-17 LAB — CBC WITH DIFFERENTIAL/PLATELET

## 2024-10-17 LAB — MAGNESIUM: Magnesium: 2.6 mg/dL — ABNORMAL HIGH (ref 1.7–2.4)

## 2024-10-17 LAB — EPSTEIN BARR VRS(EBV DNA BY PCR): EBV DNA QN by PCR: NEGATIVE [IU]/mL

## 2024-10-17 LAB — DIFFERENTIAL
Abs Immature Granulocytes: 0.02 K/uL (ref 0.00–0.07)
Basophils Absolute: 0.1 K/uL (ref 0.0–0.1)
Basophils Relative: 1 %
Eosinophils Absolute: 0.1 K/uL (ref 0.0–0.5)
Eosinophils Relative: 2 %
Immature Granulocytes: 0 %
Lymphocytes Relative: 69 %
Lymphs Abs: 3.6 K/uL (ref 0.7–4.0)
Monocytes Absolute: 0.3 K/uL (ref 0.1–1.0)
Monocytes Relative: 6 %
Neutro Abs: 1.1 K/uL — ABNORMAL LOW (ref 1.7–7.7)
Neutrophils Relative %: 22 %

## 2024-10-17 LAB — CBC
HCT: 41 % (ref 39.0–52.0)
Hemoglobin: 13.8 g/dL (ref 13.0–17.0)
MCH: 30.2 pg (ref 26.0–34.0)
MCHC: 33.7 g/dL (ref 30.0–36.0)
MCV: 89.7 fL (ref 80.0–100.0)
Platelets: 90 K/uL — ABNORMAL LOW (ref 150–400)
RBC: 4.57 MIL/uL (ref 4.22–5.81)
RDW: 12.6 % (ref 11.5–15.5)
WBC: 5.1 K/uL (ref 4.0–10.5)
nRBC: 0 % (ref 0.0–0.2)

## 2024-10-17 LAB — FOLATE: Folate: 10.7 ng/mL (ref >5.9)

## 2024-10-17 LAB — CMV DNA BY PCR, QUALITATIVE: CMV DNA, Qual PCR: POSITIVE — AB

## 2024-10-17 LAB — IRON AND TIBC
Iron: 63 ug/dL (ref 45–182)
Saturation Ratios: 23 % (ref 17.9–39.5)
TIBC: 270 ug/dL (ref 250–450)
UIBC: 207 ug/dL

## 2024-10-17 LAB — FERRITIN: Ferritin: 1248 ng/mL — ABNORMAL HIGH (ref 24–336)

## 2024-10-17 LAB — VITAMIN B12: Vitamin B-12: 519 pg/mL (ref 180–914)

## 2024-10-17 NOTE — Consult Note (Signed)
 [facility]    Date of Admission:  10/13/2024     Reason for Consult: fever of unknown origin/pancytopenia    Referring Provider: Maree Bracken     Lines:  Peripheral iv's  Abx: 12/14-15 vanc 12/14-15 piptazo 12/13 ceftriaxone    Outpatient 3 days doxycycline and 1 day azithromycin         Assessment: 28 yo male (spouse a nurse at [employer]), married with young children, admitted for sepsis syndrome and pancytopenia/hepatitis  No hx splenectomy  Sx onset since after thanksgiving and started with nonspecific malaise/fever/decreased appetite not improved on 3 day doxy  Labs showed mainly thrombocytopenic process with 2 days mild anemia and mild leukopenia which resolved outside of the thrombocytopenia  He also subjectively feels better  Imaging nonsepcific 1 small lung nodule and paraesophageal LAD  Exposure/epidemiology and lack of response to doxy suggest viral process that is perhaps coming to a tail end  Low suspicion for bacterial process including tick (wrong season and sx not consistent with), tb/endemic fungi, hematologic/rheumatologic process  He looks rather well for hlh  Sx evolution do not suggest a secondary lupus process   Discussed with primary team and hematology team  ------------------ 10/17/24 id assessment   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 Cmv viremia is not uncommon in acute illness from other process so will see what other serology show  Stable improving lft  Improving thrombocytopenia  Afebrile  Eitherway this does look like acute viral process now much improved/resolving   Micro: 12/13 bcx negative 12/14 flu/covid/rsv pcr negative 12/15 resp viral pcr negative 12/15 hepatitis serology negative 12/15 hiv serology negative  12/15 cmv pcr qualitative positive 12/15 parvovirus pcr; ebv pcr in  process  12/17 cmv igg/igm; ebv antibody; parvovirus antibody in process    Plan: If patient continues to improve ok to discharge from id standpoint I can f/u in clinic on 11/20/24 I will follow pending serology -- nothing to do outside of expectant management if positive  Maintain standard isolation precaution while inhouse Discussed with primary team   I spent more than 20 minute reviewing data/chart, and coordinating care, providing diagnostics/treatment plan with treatment team   ------------------------------------------------ Principal Problem:   Fever, unknown origin Active Problems:   Thrombocytopenia   Chronic hepatitis C with hepatic coma (HCC)   Transaminitis   Subjective: Remains afebrile Thrombocytopenia much improved Anemia/leukopenia had improved   Cmv pcr positive   Family History  Problem Relation Age of Onset   Diabetes Mother    Diabetes Father     Social History[1]  Allergies[2]  Review of Systems: ROS All Other ROS was negative, except mentioned above   Past Medical History:  Diagnosis Date   GERD (gastroesophageal reflux disease)    Sleep apnea        Scheduled Meds: Continuous Infusions: PRN Meds:.acetaminophen  **OR** acetaminophen , ondansetron  **OR** ondansetron  (ZOFRAN ) IV, mouth rinse   OBJECTIVE: Blood pressure 124/80, pulse 85, temperature 98.7 F (37.1 C), temperature source Oral, resp. rate 16, height 6' (1.829 m), weight 105.6 kg, SpO2 97%.  Physical Exam  General/constitutional: no distress, pleasant HEENT: Normocephalic, PER, Conj Clear, EOMI, Oropharynx clear Neck supple CV: rrr no mrg Lungs: clear to auscultation, normal respiratory effort Abd: Soft, Nontender Ext: no edema Skin: No Rash Neuro: nonfocal MSK: no peripheral joint swelling/tenderness/warmth; back spines nontender    Lab Results Lab Results  Component Value Date   WBC 5.1 10/17/2024   HGB 13.8 10/17/2024   HCT 41.0 10/17/2024   MCV  89.7 10/17/2024   PLT 90 (L) 10/17/2024    Lab Results  Component Value Date   CREATININE 0.97 10/17/2024   BUN 15 10/17/2024   NA 142 10/17/2024   K 4.4 10/17/2024   CL 107 10/17/2024   CO2 27 10/17/2024    Lab Results  Component Value Date   ALT 83 (H) 10/17/2024   AST 45 (H) 10/17/2024   ALKPHOS 96 10/17/2024   BILITOT 0.5 10/17/2024      Microbiology: Recent Results (from the past 240 hours)  Blood Culture (routine x 2)     Status: None (Preliminary result)   Collection Time: 10/13/24 10:40 PM   Specimen: BLOOD RIGHT ARM  Result Value Ref Range Status   Specimen Description BLOOD RIGHT ARM  Final   Special Requests   Final    BOTTLES DRAWN AEROBIC AND ANAEROBIC Blood Culture adequate volume   Culture   Final    NO GROWTH 4 DAYS Performed at [facility], [address], [place], [place] [address]    Report Status PENDING  Incomplete  Blood Culture (routine x 2)     Status: None (Preliminary result)   Collection Time: 10/13/24 10:50 PM   Specimen: BLOOD  Result Value Ref Range Status   Specimen Description   Final    BLOOD LEFT ANTECUBITAL Performed at [facility], [address] [place], [place] [address]    Special Requests   Final    BOTTLES DRAWN AEROBIC AND ANAEROBIC Blood Culture adequate volume   Culture   Final    NO GROWTH 4 DAYS Performed at [facility], [address] [place], [place] [address]    Report Status PENDING  Incomplete  Resp panel by RT-PCR (RSV, Flu A&B, Covid) Anterior Nasal Swab     Status: None   Collection Time: 10/14/24  3:37 AM   Specimen: Anterior Nasal Swab  Result Value Ref Range Status   SARS Coronavirus 2 by RT PCR NEGATIVE NEGATIVE Final    Comment: (NOTE) SARS-CoV-2 target nucleic acids are NOT DETECTED.  The SARS-CoV-2 RNA is generally detectable in upper respiratory specimens during the acute phase of infection. The lowest concentration of SARS-CoV-2 viral copies this assay can detect is 138 copies/mL. A  negative result does not preclude SARS-Cov-2 infection and should not be used as the sole basis for treatment or other patient management decisions. A negative result may occur with  improper specimen collection/handling, submission of specimen other than nasopharyngeal swab, presence of viral mutation(s) within the areas targeted by this assay, and inadequate number of viral copies(<138 copies/mL). A negative result must be combined with clinical observations, patient history, and epidemiological information. The expected result is Negative.  Fact Sheet for Patients:  bloggercourse.com  Fact Sheet for Healthcare Providers:  seriousbroker.it  This test is no t yet approved or cleared by the [place]  FDA and  has been authorized for detection and/or diagnosis of SARS-CoV-2 by FDA under an Emergency Use Authorization (EUA). This EUA will remain  in effect (meaning this test can be used) for the duration of the COVID-19 declaration under Section 564(b)(1) of the Act, 21 U.S.C.section 360bbb-3(b)(1), unless the authorization is terminated  or revoked sooner.       Influenza A by PCR NEGATIVE NEGATIVE Final   Influenza B by PCR NEGATIVE NEGATIVE Final    Comment: (NOTE)  The Xpert Xpress SARS-CoV-2/FLU/RSV plus assay is intended as an aid in the diagnosis of influenza from Nasopharyngeal swab specimens and should not be used as a sole basis for treatment. Nasal washings and aspirates are unacceptable for Xpert Xpress SARS-CoV-2/FLU/RSV testing.  Fact Sheet for Patients: bloggercourse.com  Fact Sheet for Healthcare Providers: seriousbroker.it  This test is not yet approved or cleared by the [place]  FDA and has been authorized for detection and/or diagnosis of SARS-CoV-2 by FDA under an Emergency Use Authorization (EUA). This EUA will remain in effect (meaning this test can  be used) for the duration of the COVID-19 declaration under Section 564(b)(1) of the Act, 21 U.S.C. section 360bbb-3(b)(1), unless the authorization is terminated or revoked.     Resp Syncytial Virus by PCR NEGATIVE NEGATIVE Final    Comment: (NOTE) Fact Sheet for Patients: bloggercourse.com  Fact Sheet for Healthcare Providers: seriousbroker.it  This test is not yet approved or cleared by the [place]  FDA and has been authorized for detection and/or diagnosis of SARS-CoV-2 by FDA under an Emergency Use Authorization (EUA). This EUA will remain in effect (meaning this test can be used) for the duration of the COVID-19 declaration under Section 564(b)(1) of the Act, 21 U.S.C. section 360bbb-3(b)(1), unless the authorization is terminated or revoked.  Performed at [facility], [address] [place], [place] [address]   MRSA Next Gen by PCR, Nasal     Status: Abnormal   Collection Time: 10/15/24  9:53 AM   Specimen: Nasal Mucosa; Nasal Swab  Result Value Ref Range Status   MRSA by PCR Next Gen DETECTED (A) NOT DETECTED Final    Comment: CRITICAL RESULT CALLED TO, READ BACK BY AND VERIFIED WITH: C. KELLAM AT 1220 ON 12.15.25 BY ADGER J         The GeneXpert MRSA Assay (FDA approved for NASAL specimens only), is one component of a comprehensive MRSA colonization surveillance program. It is not intended to diagnose MRSA infection nor to guide or monitor treatment for MRSA infections. Performed at [facility], [address] [place], [place] [address]   Respiratory (~20 pathogens) panel by PCR     Status: None   Collection Time: 10/15/24  9:53 AM   Specimen: Nasopharyngeal Swab; Respiratory  Result Value Ref Range Status   Adenovirus NOT DETECTED NOT DETECTED Final   Coronavirus 229E NOT DETECTED NOT DETECTED Final    Comment: (NOTE) The Coronavirus on the Respiratory Panel, DOES NOT test for the novel  Coronavirus  (2019 nCoV)    Coronavirus HKU1 NOT DETECTED NOT DETECTED Final   Coronavirus NL63 NOT DETECTED NOT DETECTED Final   Coronavirus OC43 NOT DETECTED NOT DETECTED Final   Metapneumovirus NOT DETECTED NOT DETECTED Final   Rhinovirus / Enterovirus NOT DETECTED NOT DETECTED Final   Influenza A NOT DETECTED NOT DETECTED Final   Influenza B NOT DETECTED NOT DETECTED Final   Parainfluenza Virus 1 NOT DETECTED NOT DETECTED Final   Parainfluenza Virus 2 NOT DETECTED NOT DETECTED Final   Parainfluenza Virus 3 NOT DETECTED NOT DETECTED Final   Parainfluenza Virus 4 NOT DETECTED NOT DETECTED Final   Respiratory Syncytial Virus NOT DETECTED NOT DETECTED Final   Bordetella pertussis NOT DETECTED NOT DETECTED Final   Bordetella Parapertussis NOT DETECTED NOT DETECTED Final   Chlamydophila pneumoniae NOT DETECTED NOT DETECTED Final   Mycoplasma pneumoniae NOT DETECTED NOT DETECTED Final    Comment: Performed at [facility], [address], [place], [place] [address]  Serology:    Imaging: If present, new imagings (plain films, ct scans, and mri) have been personally visualized and interpreted; radiology reports have been reviewed. Decision making incorporated into the Impression / Recommendations.  12/14 ct abd pelv and cta chest 1. No CT evidence for acute pulmonary embolus. 2. 13 mm short axis distal paraesophageal lymph node. This is nonspecific and may be reactive. Consider follow-up CT in 3 months to ensure stability. 3. 5 mm parahilar right lung nodule. This is almost assuredly benign given patient age. 4. No acute findings in the abdomen or pelvis.  Constance ONEIDA Passer, MD [facility] [phone] pager    10/17/2024, 11:06 AM      [1]  Social History Tobacco Use   Smoking status: Never    Passive exposure: Never   Smokeless tobacco: Former    Types: Snuff  Vaping Use   Vaping status: Never Used  Substance Use Topics    Alcohol use: No   Drug use: No  [2]  Allergies Allergen Reactions   Bee Venom Anaphylaxis

## 2024-10-17 NOTE — Plan of Care (Signed)

## 2024-10-17 NOTE — Discharge Instructions (Signed)
 IMPORTANT INFORMATION: PAY CLOSE ATTENTION   PHYSICIAN DISCHARGE INSTRUCTIONS  Follow with Primary care provider  [name], [name], [name]  and other consultants as instructed by your Hospitalist Physician  SEEK MEDICAL CARE OR RETURN TO EMERGENCY ROOM IF SYMPTOMS COME BACK, WORSEN OR NEW PROBLEM DEVELOPS   Please note: You were cared for by a hospitalist during your hospital stay. Every effort will be made to forward records to your primary care provider.  You can request that your primary care provider send for your hospital records if they have not received them.  Once you are discharged, your primary care physician will handle any further medical issues. Please note that NO REFILLS for any discharge medications will be authorized once you are discharged, as it is imperative that you return to your primary care physician (or establish a relationship with a primary care physician if you do not have one) for your post hospital discharge needs so that they can reassess your need for medications and monitor your lab values.  Please get a complete blood count and chemistry panel checked by your Primary MD at your next visit, and again as instructed by your Primary MD.  Get Medicines reviewed and adjusted: Please take all your medications with you for your next visit with your Primary MD  Laboratory/radiological data: Please request your Primary MD to go over all hospital tests and procedure/radiological results at the follow up, please ask your primary care provider to get all Hospital records sent to his/her office.  In some cases, they will be blood work, cultures and biopsy results pending at the time of your discharge. Please request that your primary care provider follow up on these results.  If you are diabetic, please bring your blood sugar readings with you to your follow up appointment with primary care.    Please call and make your follow up appointments as soon as possible.    Also  Note the following: If you experience worsening of your admission symptoms, develop shortness of breath, life threatening emergency, suicidal or homicidal thoughts you must seek medical attention immediately by calling 911 or calling your MD immediately  if symptoms less severe.  You must read complete instructions/literature along with all the possible adverse reactions/side effects for all the Medicines you take and that have been prescribed to you. Take any new Medicines after you have completely understood and accpet all the possible adverse reactions/side effects.   Do not drive when taking Pain medications or sleeping medications (Benzodiazepines)  Do not take more than prescribed Pain, Sleep and Anxiety Medications. It is not advisable to combine anxiety,sleep and pain medications without talking with your primary care practitioner  Special Instructions: If you have smoked or chewed Tobacco  in the last 2 yrs please stop smoking, stop any regular Alcohol  and or any Recreational drug use.  Wear Seat belts while driving.  Do not drive if taking any narcotic, mind altering or controlled substances or recreational drugs or alcohol.

## 2024-10-17 NOTE — Discharge Summary (Addendum)
 Physician Discharge Summary  Mitchell Cochran FMW:[id number] DOB: 1996-09-22 DOA: 10/13/2024  PCP: Mitchell [name], [name] Hematology: [facility] Mitchell Cochran ID: Mitchell. Constance Cochran  Admit date: 10/13/2024 Discharge date: 10/17/2024  Admitted From:  Home  Disposition: Home   Recommendations for Outpatient Follow-up:  Follow up with ID Mitchell. Passer on 11/20/24 Follow up with [facility] in 2 weeks with labs  Please obtain CMP/CBC in 2 weeks Please follow up on the following pending results: Final serologies outstanding at DC   Home Health: NA   Discharge Condition: STABLE   CODE STATUS: FULL DIET:  regular    Brief Hospitalization Summary: Please see all hospital notes, images, labs for full details of the hospitalization. Admission provider HPI:  28 y.o. male with medical history significant of GERD who presents emergency department with onset of fever on 11/26, this resolved after about 4 days, fever returned after about 2 days and it was usually in the evening and was associated with diaphoresis.  On Sunday (12/7) he went to an urgent care and was started on doxycycline, he went to his PCP on Thursday (12/11) and was started on Z-Pak but continued to have fever with maximum temperature being 103F.  He has been taking Tylenol  and ibuprofen as needed for the fever.  He denies any sick contacts at home or at work and denies any tick bite, recent travel or exposure to wild animals.  Patient also denies chest pain, shortness of breath, nausea, vomiting, diarrhea.  Patient was empirically started on broad-spectrum IV antibiotics and there has been no growth on blood cultures to date.  Discussed case with ID, Mitchell. Passer and Mitchell Cochran for thrombocytopenia.  Hospital Course by listed problems addressed  Fever of unknown origin -- RESOLVED  Procalcitonin 0.6 Chest x-ray negative Respiratory panel virus negative, full respiratory panel negative  CT studies unremarkable except for some reactive  lymphadenopathy Parvovirus, EBV, CMV, LDH studies ordered as well as ESR, CRP, rheumatoid factor, and lupus workup ESR and CRP elevated HIV and hepatitis panel negative -Antibiotics discontinued 12/15 -CMV pcr qualitative is positive and could suggest acute cmv infection if igm positive (still outstanding at this time) -discussed with ID and heme/onc and ok to DC home today as he says he is feeling great and eager to go home and agreeable to outpatient follow up with ID and hem/onc.  They are planning to follow up on outstanding labs outpatient.    Thrombocytopenia-Improving Fortunately platelets having improved to 90 -Pt feeling well and no bleeding complications -discussed with hem/onc Mitchell. 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) Diet and lifestyle modification    Discharge Diagnoses:  Principal Problem:   Fever, unknown origin Active Problems:   Thrombocytopenia   Transaminitis   Discharge Instructions: Discharge Instructions     Ambulatory referral to Hematology / Oncology   Complete by: As directed       Allergies as of 10/17/2024       Reactions   Bee Venom Anaphylaxis        Medication List     STOP taking these medications    azithromycin  250 MG tablet Commonly known as: ZITHROMAX    benzonatate  200 MG capsule Commonly known as: TESSALON    doxycycline 100 MG  tablet Commonly known as: VIBRA-TABS   predniSONE  10 MG tablet Commonly known as: DELTASONE    promethazine -dextromethorphan 6.25-15 MG/5ML syrup Commonly known as: PROMETHAZINE -DM       TAKE these medications    acetaminophen  500 MG tablet Commonly known as: TYLENOL  Take 1,000 mg by mouth every 6 (six) hours as needed for mild pain (pain score 1-3).   albuterol  108 (90 Base) MCG/ACT  inhaler Commonly known as: VENTOLIN  HFA Inhale 2 puffs into the lungs every 4 (four) hours as needed. What changed: reasons to take this   EPINEPHrine  0.3 mg/0.3 mL Soaj injection Commonly known as: EPI-PEN Inject 0.3 mg into the muscle as needed for anaphylaxis.   ibuprofen 800 MG tablet Commonly known as: ADVIL Take 800 mg by mouth every 6 (six) hours as needed for mild pain (pain score 1-3).   omeprazole  40 MG capsule Commonly known as: PRILOSEC Take 1 capsule (40 mg total) by mouth in the morning and at bedtime.        Follow-up Information     [name], [name], [name] Follow up.   Specialty: Family Medicine Contact information: [address] [place] [address] [phone]         [place] Mitchell DASEN, MD. Go on 11/20/2024.   Specialty: Infectious Diseases Why: Hospital Follow Up as 3:30pm as scheduled Contact information: [address] [place] [address] [place] [address] [phone]         [facility] Schedule an appointment as soon as possible for a visit in 2 week(s).   Specialty: Oncology Why: Hospital Follow Up Contact information: [address] [place]  [address] [phone]               Allergies[1] Allergies as of 10/17/2024       Reactions   Bee Venom Anaphylaxis        Medication List     STOP taking these medications    azithromycin  250 MG tablet Commonly known as: ZITHROMAX    benzonatate  200 MG capsule Commonly known as: TESSALON    doxycycline 100 MG tablet Commonly known as: VIBRA-TABS   predniSONE  10 MG tablet Commonly known as: DELTASONE    promethazine -dextromethorphan 6.25-15 MG/5ML syrup Commonly known as: PROMETHAZINE -DM       TAKE these medications    acetaminophen  500 MG tablet Commonly known as: TYLENOL  Take 1,000 mg by mouth every 6 (six) hours as needed for mild pain (pain score 1-3).   albuterol  108 (90 Base) MCG/ACT inhaler Commonly  known as: VENTOLIN  HFA Inhale 2 puffs into the lungs every 4 (four) hours as needed. What changed: reasons to take this   EPINEPHrine  0.3 mg/0.3 mL Soaj injection Commonly known as: EPI-PEN Inject 0.3 mg into the muscle as needed for anaphylaxis.   ibuprofen 800 MG tablet Commonly known as: ADVIL Take 800 mg by mouth every 6 (six) hours as needed for mild pain (pain score 1-3).   omeprazole  40 MG capsule Commonly known as: PRILOSEC Take 1 capsule (40 mg total) by mouth in the morning and at bedtime.        Procedures/Studies: No results found.    Subjective: Pt says he feels well, no fever or chills.    Discharge Exam: Vitals:   10/16/24 1945 10/17/24 0348  BP: 137/80 124/80  Pulse: 95 85  Resp: 16 16  Temp: 99.3 F (37.4 C) 98.7 F (37.1 C)  SpO2: 97% 97%   Vitals:   10/16/24 0404  10/16/24 1451 10/16/24 1945 10/17/24 0348  BP: 117/76 124/79 137/80 124/80  Pulse: 81 86 95 85  Resp: 18 18 16 16   Temp: 98.4 F (36.9 C) 98 F (36.7 C) 99.3 F (37.4 C) 98.7 F (37.1 C)  TempSrc: Oral Oral Oral Oral  SpO2: 98% 98% 97% 97%  Weight:      Height:       General: Pt is alert, awake, not in acute distress Cardiovascular: normal S1/S2 +, no rubs, no gallops Respiratory: CTA bilaterally, no wheezing, no rhonchi Abdominal: Soft, NT, ND, bowel sounds + Extremities: no edema, no cyanosis   The results of significant diagnostics from this hospitalization (including imaging, microbiology, ancillary and laboratory) are listed below for reference.     Microbiology: No results found for this or any previous visit (from the past 240 hours).    Labs: BNP (last 3 results) No results for input(s): BNP in the last 8760 hours. Basic Metabolic Panel: No results for input(s): NA, K, CL, CO2, GLUCOSE, BUN, CREATININE, CALCIUM, MG, PHOS in the last 168 hours.  Liver Function Tests: No results for input(s): AST, ALT, ALKPHOS, BILITOT, PROT,  ALBUMIN in the last 168 hours.  No results for input(s): LIPASE, AMYLASE in the last 168 hours. No results for input(s): AMMONIA in the last 168 hours. CBC: No results for input(s): WBC, NEUTROABS, HGB, HCT, MCV, PLT in the last 168 hours.  Cardiac Enzymes: No results for input(s): CKTOTAL, CKMB, CKMBINDEX, TROPONINI in the last 168 hours. BNP: Invalid input(s): POCBNP CBG: No results for input(s): GLUCAP in the last 168 hours. D-Dimer No results for input(s): DDIMER in the last 72 hours. Hgb A1c No results for input(s): HGBA1C in the last 72 hours. Lipid Profile No results for input(s): CHOL, HDL, LDLCALC, TRIG, CHOLHDL, LDLDIRECT in the last 72 hours. Thyroid function studies No results for input(s): TSH, T4TOTAL, T3FREE, THYROIDAB in the last 72 hours.  Invalid input(s): FREET3 Anemia work up No results for input(s): VITAMINB12, FOLATE, FERRITIN, TIBC, IRON, RETICCTPCT in the last 72 hours.  Urinalysis    Component Value Date/Time   COLORURINE AMBER (A) 10/14/2024 0331   APPEARANCEUR CLEAR 10/14/2024 0331   LABSPEC 1.026 10/14/2024 0331   PHURINE 5.0 10/14/2024 0331   GLUCOSEU NEGATIVE 10/14/2024 0331   HGBUR NEGATIVE 10/14/2024 0331   BILIRUBINUR NEGATIVE 10/14/2024 0331   KETONESUR 20 (A) 10/14/2024 0331   PROTEINUR 100 (A) 10/14/2024 0331   NITRITE NEGATIVE 10/14/2024 0331   LEUKOCYTESUR NEGATIVE 10/14/2024 0331   Sepsis Labs No results for input(s): WBC in the last 168 hours.  Invalid input(s): PROCALCITONIN, LACTICIDVEN  Microbiology No results found for this or any previous visit (from the past 240 hours).  Time coordinating discharge: 32 mins  SIGNED:  Afton Louder, MD  [facility] 11/15/2024, 8:25 AM How to contact the [facility] Attending or Consulting provider 7A - 7P or covering provider during after hours 7P -7A, for this patient?  Check the care team in [facility] and look  for a) attending/consulting [facility] provider listed and b) the [facility] team listed Log into [email or web address] and use [place]'s universal password to access. If you do not have the password, please contact the hospital operator. Locate the [facility] provider you are looking for under [facility] and page to a number that you can be directly reached. If you still have difficulty reaching the provider, please page the [facility] (Director on Call) for the Hospitalists listed on amion for assistance.     [1]  Allergies Allergen Reactions   Bee Venom Anaphylaxis

## 2024-10-18 ENCOUNTER — Telehealth (INDEPENDENT_AMBULATORY_CARE_PROVIDER_SITE_OTHER): Payer: Self-pay

## 2024-10-18 ENCOUNTER — Ambulatory Visit: Admitting: Internal Medicine

## 2024-10-18 ENCOUNTER — Ambulatory Visit: Payer: Self-pay | Admitting: Internal Medicine

## 2024-10-18 LAB — CULTURE, BLOOD (ROUTINE X 2)
Culture: NO GROWTH
Culture: NO GROWTH
Special Requests: ADEQUATE
Special Requests: ADEQUATE

## 2024-10-18 LAB — EPSTEIN-BARR VIRUS (EBV) ANTIBODY PROFILE
EBV NA IgG: 68.1 U/mL — ABNORMAL HIGH (ref 0.0–17.9)
EBV VCA IgG: 146 U/mL — ABNORMAL HIGH (ref 0.0–17.9)
EBV VCA IgM: >160 U/mL — ABNORMAL HIGH (ref 0.0–35.9)

## 2024-10-18 LAB — CMV ANTIBODY, IGG (EIA): CMV Ab - IgG: 7.7 U/mL — ABNORMAL HIGH (ref 0.00–0.59)

## 2024-10-18 LAB — FLOW CYTOMETRY

## 2024-10-18 LAB — CMV IGM: CMV IgM: >240 [AU]/ml — ABNORMAL HIGH (ref 0.0–29.9)

## 2024-10-18 LAB — COPPER, SERUM: Copper: 139 ug/dL — ABNORMAL HIGH (ref 63–121)

## 2024-10-18 LAB — PARVOVIRUS B19 ANTIBODY, IGG AND IGM
Parovirus B19 IgG Abs: 6.8 {index} — ABNORMAL HIGH (ref 0.0–0.8)
Parovirus B19 IgM Abs: 0.4 {index} (ref 0.0–0.8)

## 2024-10-18 LAB — RHEUMATOID FACTOR: Rheumatoid fact SerPl-aCnc: 35.1 [IU]/mL — ABNORMAL HIGH (ref <14.0)

## 2024-10-18 LAB — HUMAN PARVOVIRUS DNA DETECTION BY PCR: Parvovirus B19, PCR: NEGATIVE

## 2024-10-18 LAB — SURGICAL PATHOLOGY

## 2024-10-18 NOTE — Transitions of Care (Post Inpatient/ED Visit) (Signed)
° °  10/18/2024  Name: Mitchell Cochran MRN: [id number] DOB: [birth year]  Today's TOC FU Call Status: Today's TOC FU Call Status:: Successful TOC FU Call Completed TOC FU Call Complete Date: 10/18/24  Patient's Name and Date of Birth confirmed. Name, DOB  Transition Care Management Follow-up Telephone Call Date of Discharge: 10/17/24 [facility] Type of Discharge: Inpatient Admission Primary Inpatient Discharge Diagnosis:: fever How have you been since you were released from the hospital?: Better Any questions or concerns?: No  Items Reviewed: Did you receive and understand the discharge instructions provided?: Yes Medications obtained,verified, and reconciled?: Yes (Medications Reviewed) Any new allergies since your discharge?: No Dietary orders reviewed?: Yes Do you have support at home?: Yes People in Home [RPT]: spouse  Medications Reviewed Today: Medications Reviewed Today     Reviewed by Emmitt Pan, LPN (Licensed Practical Nurse) on 10/18/24 at 1237  Med List Status: <None>   Medication Order Taking? Sig Documenting Provider Last Dose Status Informant  acetaminophen  (TYLENOL ) 500 MG tablet [id number] Yes Take 1,000 mg by mouth every 6 (six) hours as needed for mild pain (pain score 1-3). [provider]  Active Spouse/Significant Other, Pharmacy Records  albuterol  (VENTOLIN  HFA) 108 [phone] Base) MCG/ACT inhaler [id number] Yes Inhale 2 puffs into the lungs every 4 (four) hours as needed.  Patient taking differently: Inhale 2 puffs into the lungs every 4 (four) hours as needed for wheezing or shortness of breath.   Stuart Vernell Norris, PA-C  Active Spouse/Significant Other, Pharmacy Records  EPINEPHrine  0.3 mg/0.3 mL IJ SOAJ injection [id number] Yes Inject 0.3 mg into the muscle as needed for anaphylaxis. Suellen Cantor A, PA-C  Active Spouse/Significant Other, Pharmacy Records  ibuprofen (ADVIL) 800 MG tablet [id number] Yes Take 800 mg by mouth  every 6 (six) hours as needed for mild pain (pain score 1-3). [provider]  Active Spouse/Significant Other, Pharmacy Records  omeprazole  (PRILOSEC) 40 MG capsule [id number] Yes Take 1 capsule (40 mg total) by mouth in the morning and at bedtime. Cinderella Deatrice FALCON, MD  Active Spouse/Significant Other, Pharmacy Records            Home Care and Equipment/Supplies: Were Home Health Services Ordered?: NA Any new equipment or medical supplies ordered?: NA  Functional Questionnaire: Do you need assistance with bathing/showering or dressing?: No Do you need assistance with meal preparation?: No Do you need assistance with eating?: No Do you have difficulty maintaining continence: No Do you need assistance with getting out of bed/getting out of a chair/moving?: No Do you have difficulty managing or taking your medications?: No  Follow up appointments reviewed: PCP Follow-up appointment confirmed?: No (no avail appts, sent message to staff to schedule) MD Provider Line Number:[phone] Given: No Specialist Hospital Follow-up appointment confirmed?: No Reason Specialist Follow-Up Not Confirmed: Patient has Specialist Provider Number and will Call for Appointment Do you need transportation to your follow-up appointment?: No Do you understand care options if your condition(s) worsen?: Yes-patient verbalized understanding    SIGNATURE Pan Emmitt, LPN [facility] Nurse Health Advisor Direct Dial [phone]

## 2024-10-19 ENCOUNTER — Telehealth: Payer: Self-pay

## 2024-10-19 NOTE — Telephone Encounter (Unsigned)
 Copied from CRM #[id number]. Topic: Clinical - Lab/Test Results >> Oct 19, 2024 12:12 PM [place] MATSU wrote: Reason for CRM: Patients wife called in requesting clarification on labs for patient that were done while he was inpatient. Per patients wife  she would like more information.    May you please assist,

## 2024-10-22 ENCOUNTER — Telehealth: Payer: Self-pay | Admitting: Family Medicine

## 2024-10-22 DIAGNOSIS — D696 Thrombocytopenia, unspecified: Secondary | ICD-10-CM

## 2024-10-22 NOTE — Progress Notes (Addendum)
" ° °  Virtual Visit via Video Note  I connected with Mitchell Cochran on 10/24/2024 at  1:00 PM EST by a video enabled telemedicine application and verified that I am speaking with the correct person using two identifiers.  Patient Location: Home Provider Location: Home Office  I discussed the limitations, risks, security, and privacy concerns of performing an evaluation and management service by video and the availability of in person appointments. I also discussed with the patient that there may be a patient responsible charge related to this service. The patient expressed understanding and agreed to proceed.  Subjective: PCP: Mitchell [name], [name]  Chief Complaint  Patient presents with   Hospitalization Follow-up   HPI The patient is seen today for hospital follow-up. He was admitted to the hospital on 10/13/2024 for fever of unknown origin. 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 He was discharged and encouraged to follow up with hematology/oncology, with an appointment scheduled for November 08, 2024. He reports that he is currently asymptomatic, with no fever, night sweats, or chills.     ROS: Per HPI Current Medications[1]  Observations/Objective: There were no vitals filed for this visit. Physical Exam Patient is well-developed, well-nourished in no acute distress.  Resting comfortably at home.  Head is normocephalic, atraumatic.  No labored breathing.  Speech is clear and coherent with logical content.  Patient is alert and oriented at baseline.   Assessment and Plan: Thrombocytopenia -     CBC with Differential/Platelet -     BMP8+EGFR  Labs and imaging studies reviewed Pending labs Encouraged to follow up with oncology as scheduled  Follow Up Instructions: No follow-ups on file.   I discussed the assessment and treatment plan with the patient. The patient was provided an opportunity to ask questions, and all  were answered. The patient agreed with the plan and demonstrated an understanding of the instructions.   The patient was advised to call back or seek an in-person evaluation if the symptoms worsen or if the condition fails to improve as anticipated.  The above assessment and management plan was discussed with the patient. The patient verbalized understanding of and has agreed to the management plan.   Meade Cochran Edman, [name]     [1]  Current Outpatient Medications:    acetaminophen  (TYLENOL ) 500 MG tablet, Take 1,000 mg by mouth every 6 (six) hours as needed for mild pain (pain score 1-3)., Disp: , Rfl:    albuterol  (VENTOLIN  HFA) 108 (90 Base) MCG/ACT inhaler, Inhale 2 puffs into the lungs every 4 (four) hours as needed. (Patient taking differently: Inhale 2 puffs into the lungs every 4 (four) hours as needed for wheezing or shortness of breath.), Disp: 18 g, Rfl: 0   EPINEPHrine  0.3 mg/0.3 mL IJ SOAJ injection, Inject 0.3 mg into the muscle as needed for anaphylaxis., Disp: 1 each, Rfl: 0   ibuprofen (ADVIL) 800 MG tablet, Take 800 mg by mouth every 6 (six) hours as needed for mild pain (pain score 1-3)., Disp: , Rfl:    omeprazole  (PRILOSEC) 40 MG capsule, Take 1 capsule (40 mg total) by mouth in the morning and at bedtime., Disp: 60 capsule, Rfl: 1  "

## 2024-10-24 NOTE — Addendum Note (Signed)
 Addended by: [name] on: 10/24/2024 09:57 PM   Modules accepted: Level of Service

## 2024-10-29 ENCOUNTER — Encounter: Payer: Self-pay | Admitting: *Deleted

## 2024-11-07 ENCOUNTER — Other Ambulatory Visit: Payer: Self-pay

## 2024-11-07 DIAGNOSIS — D696 Thrombocytopenia, unspecified: Secondary | ICD-10-CM

## 2024-11-08 ENCOUNTER — Inpatient Hospital Stay (HOSPITAL_BASED_OUTPATIENT_CLINIC_OR_DEPARTMENT_OTHER): Admitting: Oncology

## 2024-11-08 ENCOUNTER — Inpatient Hospital Stay: Attending: Oncology

## 2024-11-08 VITALS — BP 125/92 | HR 92 | Temp 99.1°F | Resp 19 | Ht 72.0 in | Wt 230.0 lb

## 2024-11-08 DIAGNOSIS — D696 Thrombocytopenia, unspecified: Secondary | ICD-10-CM | POA: Diagnosis not present

## 2024-11-08 LAB — CBC WITH DIFFERENTIAL/PLATELET
Abs Immature Granulocytes: 0.01 K/uL (ref 0.00–0.07)
Basophils Absolute: 0 K/uL (ref 0.0–0.1)
Basophils Relative: 0 %
Eosinophils Absolute: 0.2 K/uL (ref 0.0–0.5)
Eosinophils Relative: 2 %
HCT: 43.8 % (ref 39.0–52.0)
Hemoglobin: 14.9 g/dL (ref 13.0–17.0)
Immature Granulocytes: 0 %
Lymphocytes Relative: 35 %
Lymphs Abs: 2.7 K/uL (ref 0.7–4.0)
MCH: 30.8 pg (ref 26.0–34.0)
MCHC: 34 g/dL (ref 30.0–36.0)
MCV: 90.5 fL (ref 80.0–100.0)
Monocytes Absolute: 0.7 K/uL (ref 0.1–1.0)
Monocytes Relative: 9 %
Neutro Abs: 4.1 K/uL (ref 1.7–7.7)
Neutrophils Relative %: 54 %
Platelets: 150 K/uL (ref 150–400)
RBC: 4.84 MIL/uL (ref 4.22–5.81)
RDW: 14.6 % (ref 11.5–15.5)
WBC: 7.6 K/uL (ref 4.0–10.5)
nRBC: 0 % (ref 0.0–0.2)

## 2024-11-08 NOTE — Patient Instructions (Signed)
 [facility] at [facility] Discharge Instructions   You were seen and examined today by Dr. Davonna.  She reviewed the results of your lab work which are normal/stable.   Return as scheduled.    Thank you for choosing [facility] at [facility] to provide your oncology and hematology care.  To afford each patient quality time with our provider, please arrive at least 15 minutes before your scheduled appointment time.   If you have a lab appointment with the Cancer Center please come in thru the Main Entrance and check in at the main information desk.  You need to re-schedule your appointment should you arrive 10 or more minutes late.  We strive to give you quality time with our providers, and arriving late affects you and other patients whose appointments are after yours.  Also, if you no show three or more times for appointments you may be dismissed from the clinic at the providers discretion.     Again, thank you for choosing [facility].  Our hope is that these requests will decrease the amount of time that you wait before being seen by our physicians.       _____________________________________________________________  Should you have questions after your visit to [facility], please contact our office at [phone] and follow the prompts.  Our office hours are 8:00 a.m. and 4:30 p.m. Monday - Friday.  Please note that voicemails left after 4:00 p.m. may not be returned until the following business day.  We are closed weekends and major holidays.  You do have access to a nurse 24-7, just call the main number to the clinic [phone] and do not press any options, hold on the line and a nurse will answer the phone.    For prescription refill requests, have your pharmacy contact our office and allow 72 hours.    Due to Covid, you will need to wear a mask upon entering the hospital. If you do not have a mask, a mask  will be given to you at the Main Entrance upon arrival. For doctor visits, patients may have 1 support person age 39 or older with them. For treatment visits, patients can not have anyone with them due to social distancing guidelines and our immunocompromised population.

## 2024-11-08 NOTE — Progress Notes (Signed)
 " Mitchell Cochran  HEMATOLOGY FOLLOW-UP VISIT  [name], [name], [name]  REASON FOR FOLLOW-UP: Thrombocytopenia  ASSESSMENT & PLAN:  Patient is a 29 y.o. 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 CT abdomen and pelvis did not reveal hepatosplenomegaly HIV and hepatitis panel negative Nutritional workup: Normal   - Thrombocytopenia resolved at this time. - Patient is asymptomatic -Discussed in detail that is likely secondary to CMV infection and has resolved now.  No further hematological needs at this time.  Recommended patient to reach out to us  in future with questions or concerns   No orders of the defined types were placed in this encounter.   The total time spent in the appointment was 20 minutes encounter with patients including review of chart and various tests results, discussions about plan of care and coordination of care plan   All questions were answered. The patient knows to call the clinic with any problems, questions or concerns. No barriers to learning was detected.   LILLETTE Verneta SAUNDERS Teague,acting as a neurosurgeon for Mickiel Dry, MD.,have documented all relevant documentation on the behalf of Mickiel Dry, MD,as directed by  Mickiel Dry, MD while in the presence of Mickiel Dry, MD.  I, Mickiel Dry MD, have reviewed the above documentation for accuracy and completeness, and I agree with the above.    Mickiel Dry, MD 1/8/202612:12 PM   SUMMARY OF HEMATOLOGIC HISTORY: -10/13/2024: CBC diff: PLT- 77.  -10/14/2024: Normal phosphorus. Normal magnesium. CMP: Creatinine- 8.6. AST- 64. ALT- 72. -10/15/2024: LDH 634. -10/16/2024: CBC: PLT- 40.   -10/16/2024: Peripheral Blood Flow Cytometry: No abnormal B or T cell population identified.  -10/17/2024: CBC diff: PLT- 90. Ferritin- 1,248. Copper - 139.  -11/09/2023:  CBC diff within normal limits.   INTERVAL HISTORY: Mitchell Cochran 29 y.o. male following for thrombocytopenia.   Lydia does not believe he was treated for the CMV with antivirals or antibiotics. He does not have follow-up with ID. We discussed labs today, which were normal. He denies any bleeding, bruising or petechiae.    I have reviewed the past medical history, past surgical history, social history and family history with the patient   ALLERGIES:  is allergic to bee venom.  MEDICATIONS:  Current Outpatient Medications  Medication Sig Dispense Refill   acetaminophen  (TYLENOL ) 500 MG tablet Take 1,000 mg by mouth every 6 (six) hours as needed for mild pain (pain score 1-3).     albuterol  (VENTOLIN  HFA) 108 (90 Base) MCG/ACT inhaler Inhale 2 puffs into the lungs every 4 (four) hours as needed. (Patient taking differently: Inhale 2 puffs into the lungs every 4 (four) hours as needed for wheezing or shortness of breath.) 18 g 0   EPINEPHrine  0.3 mg/0.3 mL IJ SOAJ injection Inject 0.3 mg into the muscle as needed for anaphylaxis. 1 each 0   ibuprofen (ADVIL) 800 MG tablet Take 800 mg by mouth every 6 (six) hours as needed for mild pain (pain score 1-3).     omeprazole  (PRILOSEC) 40 MG capsule Take 1 capsule (40 mg total) by mouth in the morning and at bedtime. 60 capsule 1   No current facility-administered medications for this visit.    PHYSICAL EXAMINATION:   Vitals:   11/08/24 0850 11/08/24 0858  BP: (!) 131/92 (!) 125/92  Pulse: 92   Resp: 19   Temp: 99.1 F (37.3 C)   SpO2:  100%     GENERAL:alert, no distress and comfortable SKIN: skin color, texture, turgor are normal, no rashes or significant lesions LYMPH:  no palpable lymphadenopathy in the cervical, axillary or inguinal LUNGS: clear to auscultation and percussion with normal breathing effort HEART: regular rate & rhythm and no murmurs and no lower extremity edema ABDOMEN:abdomen soft, non-tender and normal bowel  sounds Musculoskeletal:no cyanosis of digits and no clubbing  NEURO: alert & oriented x 3 with fluent speech  LABORATORY DATA:  I have reviewed the data as listed  Lab Results  Component Value Date   WBC 7.6 11/08/2024   NEUTROABS 4.1 11/08/2024   HGB 14.9 11/08/2024   HCT 43.8 11/08/2024   MCV 90.5 11/08/2024   PLT 150 11/08/2024        Chemistry      Component Value Date/Time   NA 142 10/17/2024 0516   NA 140 03/19/2024 0833   K 4.4 10/17/2024 0516   CL 107 10/17/2024 0516   CO2 27 10/17/2024 0516   BUN 15 10/17/2024 0516   BUN 21 (H) 03/19/2024 0833   CREATININE 0.97 10/17/2024 0516      Component Value Date/Time   CALCIUM 9.1 10/17/2024 0516   ALKPHOS 96 10/17/2024 0516   AST 45 (H) 10/17/2024 0516   ALT 83 (H) 10/17/2024 0516   BILITOT 0.5 10/17/2024 0516   BILITOT 0.4 03/19/2024 0833        RADIOGRAPHIC STUDIES: I have personally reviewed the radiological images as listed and agreed with the findings in the report.  "

## 2024-11-20 ENCOUNTER — Inpatient Hospital Stay: Payer: Self-pay | Admitting: Internal Medicine

## 2025-03-20 ENCOUNTER — Encounter: Admitting: Family Medicine
